# Patient Record
Sex: Male | Born: 1965
Health system: Southern US, Community
[De-identification: ages and names within clinical notes are randomized; demographics above are authoritative.]

## PROBLEM LIST (undated history)

## (undated) DIAGNOSIS — E785 Hyperlipidemia, unspecified: Secondary | ICD-10-CM

## (undated) DIAGNOSIS — R519 Headache, unspecified: Secondary | ICD-10-CM

## (undated) DIAGNOSIS — F32A Depression, unspecified: Secondary | ICD-10-CM

## (undated) DIAGNOSIS — Z87442 Personal history of urinary calculi: Secondary | ICD-10-CM

## (undated) DIAGNOSIS — R51 Headache: Secondary | ICD-10-CM

## (undated) DIAGNOSIS — F329 Major depressive disorder, single episode, unspecified: Secondary | ICD-10-CM

## (undated) DIAGNOSIS — I10 Essential (primary) hypertension: Secondary | ICD-10-CM

## (undated) DIAGNOSIS — B360 Pityriasis versicolor: Secondary | ICD-10-CM

## (undated) DIAGNOSIS — T4145XA Adverse effect of unspecified anesthetic, initial encounter: Secondary | ICD-10-CM

## (undated) DIAGNOSIS — F419 Anxiety disorder, unspecified: Secondary | ICD-10-CM

## (undated) DIAGNOSIS — S069X9A Unspecified intracranial injury with loss of consciousness of unspecified duration, initial encounter: Secondary | ICD-10-CM

## (undated) DIAGNOSIS — T8859XA Other complications of anesthesia, initial encounter: Secondary | ICD-10-CM

## (undated) DIAGNOSIS — G4730 Sleep apnea, unspecified: Secondary | ICD-10-CM

## (undated) DIAGNOSIS — I499 Cardiac arrhythmia, unspecified: Secondary | ICD-10-CM

## (undated) DIAGNOSIS — K219 Gastro-esophageal reflux disease without esophagitis: Secondary | ICD-10-CM

## (undated) DIAGNOSIS — K429 Umbilical hernia without obstruction or gangrene: Secondary | ICD-10-CM

## (undated) HISTORY — PX: OTHER SURGICAL HISTORY: SHX169

## (undated) HISTORY — PX: ADENOIDECTOMY: SUR15

## (undated) HISTORY — PX: COLONOSCOPY: SHX174

## (undated) HISTORY — DX: Essential (primary) hypertension: I10

## (undated) HISTORY — PX: MOUTH SURGERY: SHX715

## (undated) HISTORY — PX: HAND SURGERY: SHX662

## (undated) HISTORY — DX: Hyperlipidemia, unspecified: E78.5

## (undated) HISTORY — PX: ESOPHAGOGASTRODUODENOSCOPY ENDOSCOPY: SHX5814

## (undated) HISTORY — PX: TONSILLECTOMY: SUR1361

## (undated) HISTORY — PX: SIGMOIDOSCOPY: SUR1295

## (undated) HISTORY — DX: Gastro-esophageal reflux disease without esophagitis: K21.9

## (undated) HISTORY — PX: FINGER GANGLION CYST EXCISION: SHX1636

---

## 1992-03-21 HISTORY — PX: CHOLECYSTECTOMY: SHX55

## 1997-07-15 ENCOUNTER — Encounter: Admission: RE | Admit: 1997-07-15 | Discharge: 1997-10-13 | Payer: Self-pay | Admitting: Family Medicine

## 2000-03-21 DIAGNOSIS — S069X9A Unspecified intracranial injury with loss of consciousness of unspecified duration, initial encounter: Secondary | ICD-10-CM

## 2000-03-21 DIAGNOSIS — S069XAA Unspecified intracranial injury with loss of consciousness status unknown, initial encounter: Secondary | ICD-10-CM

## 2000-03-21 HISTORY — DX: Unspecified intracranial injury with loss of consciousness status unknown, initial encounter: S06.9XAA

## 2000-03-21 HISTORY — DX: Unspecified intracranial injury with loss of consciousness of unspecified duration, initial encounter: S06.9X9A

## 2002-03-05 ENCOUNTER — Encounter: Payer: Self-pay | Admitting: Family Medicine

## 2002-03-05 ENCOUNTER — Ambulatory Visit (HOSPITAL_COMMUNITY): Admission: RE | Admit: 2002-03-05 | Discharge: 2002-03-05 | Payer: Self-pay | Admitting: Family Medicine

## 2002-06-16 ENCOUNTER — Ambulatory Visit (HOSPITAL_BASED_OUTPATIENT_CLINIC_OR_DEPARTMENT_OTHER): Admission: RE | Admit: 2002-06-16 | Discharge: 2002-06-16 | Payer: Self-pay | Admitting: Family Medicine

## 2002-12-02 ENCOUNTER — Ambulatory Visit (HOSPITAL_COMMUNITY): Admission: RE | Admit: 2002-12-02 | Discharge: 2002-12-02 | Payer: Self-pay | Admitting: *Deleted

## 2002-12-02 ENCOUNTER — Encounter: Payer: Self-pay | Admitting: *Deleted

## 2003-06-27 ENCOUNTER — Ambulatory Visit (HOSPITAL_COMMUNITY): Admission: RE | Admit: 2003-06-27 | Discharge: 2003-06-27 | Payer: Self-pay | Admitting: Gastroenterology

## 2004-03-06 ENCOUNTER — Ambulatory Visit (HOSPITAL_COMMUNITY): Admission: RE | Admit: 2004-03-06 | Discharge: 2004-03-06 | Payer: Self-pay | Admitting: Family Medicine

## 2008-07-19 HISTORY — PX: CLAVICLE SURGERY: SHX598

## 2008-07-22 ENCOUNTER — Ambulatory Visit (HOSPITAL_COMMUNITY): Admission: RE | Admit: 2008-07-22 | Discharge: 2008-07-23 | Payer: Self-pay | Admitting: Orthopedic Surgery

## 2008-07-22 ENCOUNTER — Encounter: Admission: RE | Admit: 2008-07-22 | Discharge: 2008-07-22 | Payer: Self-pay | Admitting: Orthopedic Surgery

## 2009-03-21 HISTORY — PX: CLAVICLE HARDWARE REMOVAL: SHX1351

## 2009-03-31 ENCOUNTER — Ambulatory Visit (HOSPITAL_COMMUNITY): Admission: RE | Admit: 2009-03-31 | Discharge: 2009-03-31 | Payer: Self-pay | Admitting: Orthopedic Surgery

## 2009-08-18 ENCOUNTER — Ambulatory Visit (HOSPITAL_COMMUNITY): Admission: EM | Admit: 2009-08-18 | Discharge: 2009-08-18 | Payer: Self-pay | Admitting: Emergency Medicine

## 2010-01-19 ENCOUNTER — Encounter: Admission: RE | Admit: 2010-01-19 | Discharge: 2010-01-19 | Payer: Self-pay | Admitting: Internal Medicine

## 2010-06-06 LAB — CBC
MCHC: 35.4 g/dL (ref 30.0–36.0)
Platelets: 209 10*3/uL (ref 150–400)
RDW: 12.7 % (ref 11.5–15.5)

## 2010-06-06 LAB — BASIC METABOLIC PANEL
Calcium: 9.2 mg/dL (ref 8.4–10.5)
GFR calc non Af Amer: >60 mL/min (ref >60)
Potassium: 3.6 mEq/L (ref 3.5–5.1)
Sodium: 138 mEq/L (ref 135–145)

## 2010-06-06 LAB — TYPE AND SCREEN
ABO/RH(D): A POS
Antibody Screen: NEGATIVE

## 2010-06-07 LAB — BASIC METABOLIC PANEL
BUN: 17 mg/dL (ref 6–23)
Calcium: 8.6 mg/dL (ref 8.4–10.5)
GFR calc non Af Amer: >60 mL/min (ref >60)
Potassium: 3.7 mEq/L (ref 3.5–5.1)
Sodium: 137 mEq/L (ref 135–145)

## 2010-06-07 LAB — DIFFERENTIAL
Basophils Absolute: 0 10*3/uL (ref 0.0–0.1)
Eosinophils Relative: 0 % (ref 0–5)
Lymphocytes Relative: 22 % (ref 12–46)
Lymphs Abs: 2 10*3/uL (ref 0.7–4.0)
Neutro Abs: 6.8 10*3/uL (ref 1.7–7.7)

## 2010-06-07 LAB — TYPE AND SCREEN: ABO/RH(D): A POS

## 2010-06-07 LAB — CBC
HCT: 36.4 % — ABNORMAL LOW (ref 39.0–52.0)
Hemoglobin: 12.3 g/dL — ABNORMAL LOW (ref 13.0–17.0)
Platelets: 227 10*3/uL (ref 150–400)
RDW: 13.3 % (ref 11.5–15.5)
WBC: 9.2 10*3/uL (ref 4.0–10.5)

## 2010-06-07 LAB — ABO/RH: ABO/RH(D): A POS

## 2010-06-07 LAB — HEMOCCULT GUIAC POC 1CARD (OFFICE): Fecal Occult Bld: POSITIVE

## 2010-06-30 LAB — CBC
Platelets: 218 10*3/uL (ref 150–400)
RBC: 4.57 MIL/uL (ref 4.22–5.81)
WBC: 7.1 10*3/uL (ref 4.0–10.5)

## 2010-06-30 LAB — BASIC METABOLIC PANEL
BUN: 17 mg/dL (ref 6–23)
Calcium: 9.3 mg/dL (ref 8.4–10.5)
Chloride: 101 mEq/L (ref 96–112)
Creatinine, Ser: 0.86 mg/dL (ref 0.4–1.5)
GFR calc Af Amer: >60 mL/min (ref >60)
GFR calc non Af Amer: >60 mL/min (ref >60)

## 2010-06-30 LAB — TYPE AND SCREEN
ABO/RH(D): A POS
Antibody Screen: NEGATIVE

## 2010-06-30 LAB — ABO/RH: ABO/RH(D): A POS

## 2010-08-03 NOTE — Op Note (Signed)
NAME:  Adam Stephenson, CAFARO NO.:  [id number]   MEDICAL RECORD NO.:  [id number]          PATIENT TYPE:  OIB   LOCATION:  5003                         FACILITY:  [facility]   PHYSICIAN:  Burnard Bunting, M.D.    DATE OF BIRTH:  [birth year]   DATE OF PROCEDURE:  07/22/2008  DATE OF DISCHARGE:                               OPERATIVE REPORT   PREOPERATIVE DIAGNOSIS:  Left displaced comminuted clavicle fracture.   POSTOPERATIVE DIAGNOSIS:  Left displaced comminuted clavicle fracture.   PROCEDURE:  Open reduction internal fixation with pin fixation of left  comminuted displaced clavicle fracture.   SURGEON:  Burnard Bunting, MD   ASSISTANT:  Jerolyn Shin. Lavender, MD   ANESTHESIA:  General endotracheal.   ESTIMATED BLOOD LOSS:  25 mL.   DRAINS:  None.   INDICATIONS:  Vontae Court is a 45 year old patient with displaced left  clavicle fracture who presents for operative management after  explanation of risks and benefits.   PROCEDURE IN DETAIL:  The patient was brought to the operating room  where general endotracheal anesthesia was induced, placed in beach-chair  position with head in neutral position.  Time-out was called.  Left  shoulder area was prescrubed with chlorhexidine, alcohol, and Betadine  which allowed to air dry and then prepped with DuraPrep solution and  draped in sterile manner.  Collier Flowers was used to cover the operative field.  Clavicle fracture was palpated.  The patient was morbidly obese, and  that made the entire procedure more difficult and had significant levels  of difficulty to the procedure due to exposure.  Anterior incision was  made over the clavicle fracture.  Skin and subcutaneous tissue were  sharply divided.  Platysma muscle was divided.  Crossing sensory nerve  was protected.  Muscle was split.  Comminuted fracture fragments were  exposed.  The medial end was then drilled by hand and tapped.  The  lateral end was drilled by hand and tapped.  The  lateral cortex was  punctured, and under fluoroscopic guidance, pin was placed retrograde to  the lateral side taking out through a separate stab incision in the  posterolateral aspect of the shoulder, and the fracture was reduced, and  the pin was taken across with good thread purchase into the medial  fragment site.  Two comminuted fragments were then placed back into  their position and wrapped with #1 Vicryl suture.  Under fluoroscopic  guidance, the fracture was well reduced, it was stable with pin  fixation.  At this time, the incision was thoroughly irrigated and  closed using #1 Vicryl suture, 2-0 Vicryl suture, and  skin with staples.  Dr. Lenny Pastel assistance was required at all time  during the case for retraction of important neurovascular structures and  limb positioning.  Case was also made more difficult by the patient's  size.  The patient was placed in a shoulder sling.  He tolerated the  procedure well without immediate complications.      Burnard Bunting, M.D.  Electronically Signed     GSD/MEDQ  D:  07/22/2008  T:  07/23/2008  Job:  [id number]

## 2010-08-06 NOTE — Op Note (Signed)
NAME:  Adam Stephenson, Adam Stephenson                      ACCOUNT NO.:  [id number]   MEDICAL RECORD NO.:  [id number]                   PATIENT TYPE:  AMB   LOCATION:  ENDO                                 [facility]   PHYSICIAN:  John C. Madilyn Fireman, M.D.                 DATE OF BIRTH:  01/30/1966   DATE OF PROCEDURE:  06/27/2003  DATE OF DISCHARGE:                                 OPERATIVE REPORT   PROCEDURE:  Colonoscopy.   INDICATIONS FOR PROCEDURE:  Intermittent rectal bleeding in a 45 year old  patient with a family history of colon polyps.   DESCRIPTION OF PROCEDURE:  The patient was placed in the left lateral  decubitus position then placed on the pulse monitor with continuous low flow  oxygen delivered by nasal cannula. He was sedated with 62.5 mcg IV fentanyl  and 7 mg IV Versed. The Olympus video colonoscope was inserted into the  rectum and advanced to the cecum, confirmed by transillumination at  McBurney's point and visualization of the ileocecal valve and appendiceal  orifice. The prep was excellent. The cecum, ascending, transverse,  descending and sigmoid colon all appeared normal with no masses, polyps,  diverticula or other mucosal abnormalities. The rectum likewise appeared  normal and retroflexed view of the anus revealed only small internal  hemorrhoids, no stigma of hemorrhage. The scope was then withdrawn and the  patient returned to the recovery room in stable condition. The patient  tolerated the procedure well and there were no immediate complications.   IMPRESSION:  Minimal internal hemorrhoids otherwise normal study.   PLAN:  Next colon screening by colonoscopy in 10 years.                                               John C. Madilyn Fireman, M.D.    JCH/MEDQ  D:  06/27/2003  T:  06/27/2003  Job:  [id number]

## 2011-10-06 ENCOUNTER — Other Ambulatory Visit: Payer: Self-pay | Admitting: Family Medicine

## 2011-10-06 DIAGNOSIS — R609 Edema, unspecified: Secondary | ICD-10-CM

## 2011-10-10 ENCOUNTER — Other Ambulatory Visit: Payer: Self-pay

## 2011-10-11 ENCOUNTER — Ambulatory Visit
Admission: RE | Admit: 2011-10-11 | Discharge: 2011-10-11 | Disposition: A | Discharge status: Home / Self Care | Payer: 59 | Source: Ambulatory Visit | Attending: Family Medicine | Admitting: Family Medicine

## 2011-10-11 DIAGNOSIS — R609 Edema, unspecified: Secondary | ICD-10-CM

## 2013-01-22 ENCOUNTER — Ambulatory Visit: Payer: Self-pay

## 2013-02-01 ENCOUNTER — Ambulatory Visit (INDEPENDENT_AMBULATORY_CARE_PROVIDER_SITE_OTHER): Payer: 59

## 2013-02-01 ENCOUNTER — Other Ambulatory Visit: Payer: Self-pay | Admitting: *Deleted

## 2013-02-01 VITALS — BP 146/86 | HR 73 | Resp 16

## 2013-02-01 DIAGNOSIS — B351 Tinea unguium: Secondary | ICD-10-CM

## 2013-02-01 DIAGNOSIS — B353 Tinea pedis: Secondary | ICD-10-CM

## 2013-02-01 MED ORDER — TAVABOROLE 5 % EX SOLN: 1.0000 [drp] | Freq: Two times a day (BID) | CUTANEOUS | Status: DC

## 2013-02-01 MED ORDER — NAFTIFINE HCL 2 % EX CREA: 2.0000 g | TOPICAL_CREAM | Freq: Two times a day (BID) | CUTANEOUS | Status: DC

## 2013-02-01 NOTE — Patient Instructions (Signed)

## 2013-02-01 NOTE — Progress Notes (Signed)
  Subjective:    Patient ID: Adam Stephenson, male    DOB: October 08, 1965, 47 y.o.   MRN: [id number]  HPI Comments: "I still have this athletes feet, the toenails too"  N - none  L - plantar feet and toenails bilateral D - few years O - gradual C - dry, scaly skin and thick, discolored toenails A - none T - treated in Jan. 2013 with Naftin and fungi nail - used for a short time.       Review of Systems  Musculoskeletal: Positive for gait problem.  All other systems reviewed and are negative.       Objective:   Physical Exam Neurovascular status intact and unchanged pedal pulses palpable epicritic and proprioceptive sensations intact and symmetric. Patient continues to have some distal subungual onychomycosis of the hallux and lesser digits nails he is topical Fungi-Nail for several months however fail to do it on a consistent basis this was proximally 1 year ago. Patient continues to have thickening and friability of nails and request retreatment. Orthopedic biomechanical exam otherwise unremarkable noncontributory. Patient also has a much distribution of tinea with an erythematous plaque and interdigital involvement previsit treated with Naftin successfully house had recurrence due to the persistent of the nail fungus providing a source for reinfection.       Assessment & Plan:  Onychomycosis distal subungual affecting 1 through 5 nails bilateral. Also secondary tinea pedis chronic reinfection is associated with the nail infection as well. Plan at this time prescription was issued for Naftin cream 2% be applied twice daily to the affected areas of skin for at least a 4 week duration and repeat if any recurrence . Patient is also dispensed a prescription for KERYDIN 5% new topical nail antifungal. He is given the coupons for both of Naftin and keratin was instructions apply both twice daily the nail treatment should take place for at least 12 months in duration. Patient is also advised and  other treatments for both his shoes and socks also antiperspirant to help reduce moisture and perspiration in his shoes. Followup in 6-12 months as needed  Alvan Dame DPM

## 2014-03-21 HISTORY — PX: HEMORRHOID SURGERY: SHX153

## 2014-07-22 ENCOUNTER — Emergency Department (HOSPITAL_COMMUNITY)
Admission: EM | Admit: 2014-07-22 | Discharge: 2014-07-22 | Disposition: A | Discharge status: Home / Self Care | Payer: 59 | Attending: Emergency Medicine | Admitting: Emergency Medicine

## 2014-07-22 ENCOUNTER — Encounter (HOSPITAL_COMMUNITY): Payer: Self-pay

## 2014-07-22 ENCOUNTER — Emergency Department (HOSPITAL_COMMUNITY): Payer: 59

## 2014-07-22 DIAGNOSIS — E785 Hyperlipidemia, unspecified: Secondary | ICD-10-CM | POA: Diagnosis not present

## 2014-07-22 DIAGNOSIS — I10 Essential (primary) hypertension: Secondary | ICD-10-CM | POA: Insufficient documentation

## 2014-07-22 DIAGNOSIS — Z79899 Other long term (current) drug therapy: Secondary | ICD-10-CM | POA: Insufficient documentation

## 2014-07-22 DIAGNOSIS — Z792 Long term (current) use of antibiotics: Secondary | ICD-10-CM | POA: Insufficient documentation

## 2014-07-22 DIAGNOSIS — K219 Gastro-esophageal reflux disease without esophagitis: Secondary | ICD-10-CM | POA: Insufficient documentation

## 2014-07-22 DIAGNOSIS — R221 Localized swelling, mass and lump, neck: Secondary | ICD-10-CM

## 2014-07-22 DIAGNOSIS — R51 Headache: Secondary | ICD-10-CM | POA: Diagnosis present

## 2014-07-22 DIAGNOSIS — L089 Local infection of the skin and subcutaneous tissue, unspecified: Secondary | ICD-10-CM | POA: Diagnosis not present

## 2014-07-22 LAB — BASIC METABOLIC PANEL
ANION GAP: 9 (ref 5–15)
BUN: 12 mg/dL (ref 6–20)
CALCIUM: 9.1 mg/dL (ref 8.9–10.3)
CO2: 27 mmol/L (ref 22–32)
CREATININE: 0.95 mg/dL (ref 0.61–1.24)
Chloride: 102 mmol/L (ref 101–111)
GFR calc non Af Amer: >60 mL/min (ref >60)
Glucose, Bld: 95 mg/dL (ref 70–99)
Potassium: 4 mmol/L (ref 3.5–5.1)
SODIUM: 138 mmol/L (ref 135–145)

## 2014-07-22 LAB — CBC WITH DIFFERENTIAL/PLATELET
Basophils Absolute: 0 10*3/uL (ref 0.0–0.1)
Basophils Relative: 0 % (ref 0–1)
Eosinophils Absolute: 0.1 10*3/uL (ref 0.0–0.7)
Eosinophils Relative: 2 % (ref 0–5)
HCT: 41.8 % (ref 39.0–52.0)
HEMOGLOBIN: 14.4 g/dL (ref 13.0–17.0)
LYMPHS ABS: 3.8 10*3/uL (ref 0.7–4.0)
LYMPHS PCT: 49 % — AB (ref 12–46)
MCH: 30.5 pg (ref 26.0–34.0)
MCHC: 34.4 g/dL (ref 30.0–36.0)
MCV: 88.6 fL (ref 78.0–100.0)
MONOS PCT: 5 % (ref 3–12)
Monocytes Absolute: 0.4 10*3/uL (ref 0.1–1.0)
NEUTROS ABS: 3.4 10*3/uL (ref 1.7–7.7)
NEUTROS PCT: 44 % (ref 43–77)
PLATELETS: 214 10*3/uL (ref 150–400)
RBC: 4.72 MIL/uL (ref 4.22–5.81)
RDW: 13.1 % (ref 11.5–15.5)
WBC: 7.8 10*3/uL (ref 4.0–10.5)

## 2014-07-22 MED ORDER — IOHEXOL 300 MG/ML  SOLN
80.0000 mL | Freq: Once | INTRAMUSCULAR | Status: AC | PRN
  Administered 2014-07-22: 80 mL via INTRAVENOUS

## 2014-07-22 NOTE — ED Provider Notes (Signed)
CSN: [id number]     Arrival date & time 07/22/14  1532 History   First MD Initiated Contact with Patient 07/22/14 1752     Chief Complaint  Patient presents with  . Headache      Patient is a 49 y.o. male presenting with headaches. The history is provided by the patient. No language interpreter was used.  Headache  Mr. Sabala presents for evaluation of scalp swelling.  He awoke yesterday with swelling behind his right ear with associated pain.  He saw his pcp yesterday, who started him on Doxcycline BID and referred to general surgery.  He was seen by Dermatology today, who referred him the ED for CT scan.  He reports pain in the area and pain with rotating his head to the left.  He denies any recent trauma, fevers, ear pain, N/V, numbness, weakness.  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  Sxs are moderate, constant, worsening.    Past Medical History  Diagnosis Date  . Hyperlipidemia   . Hypertension   . GERD (gastroesophageal reflux disease)    Past Surgical History  Procedure Laterality Date  . Cholecystectomy    . Clavicle surgery    . Hand surgery    . Finger ganglion cyst excision    . Mouth surgery    . Tonsillectomy    . Adenoidectomy    . Esophagogastroduodenoscopy endoscopy    . Colonoscopy    . Sigmoidoscopy     No family history on file. History  Substance Use Topics  . Smoking status: Never Smoker   . Smokeless tobacco: Not on file  . Alcohol Use: Yes     Comment: occassional    Review of Systems  Neurological: Positive for headaches.  All other systems reviewed and are negative.     Allergies  Lexapro  Home Medications   Prior to Admission medications   Medication Sig Start Date End Date Taking? Authorizing Provider  atorvastatin (LIPITOR) 40 MG tablet Take 40 mg by mouth daily.  12/07/12  Yes Historical Provider, MD  doxycycline (VIBRA-TABS) 100 MG tablet Take 100 mg by mouth 2 (two) times daily. Start medication on 07-21-14    Yes Historical Provider, MD  pantoprazole (PROTONIX) 40 MG tablet Take 40 mg by mouth daily.   Yes Historical Provider, MD  ramipril (ALTACE) 10 MG capsule Take 10 mg by mouth.  01/03/13  Yes Historical Provider, MD  Naftifine HCl (NAFTIN) 2 % CREA Apply 2 g topically 2 (two) times daily. Patient not taking: Reported on 07/22/2014 02/01/13   Harriet Masson, DPM  Tavaborole (KERYDIN) 5 % SOLN Apply 1 drop topically 2 (two) times daily. Patient not taking: Reported on 07/22/2014 02/01/13   Harriet Masson, DPM   BP 168/93 mmHg  Pulse 70  Temp(Src) 98 F (36.7 C) (Oral)  Resp 20  Ht 5\' 11"  (1.803 m)  Wt 341 lb (154.677 kg)  BMI 47.58 kg/m2  SpO2 96% Physical Exam  Constitutional: He is oriented to person, place, and time. He appears well-developed and well-nourished.  HENT:  Head: Normocephalic and atraumatic.  Right Ear: External ear normal.  Left Ear: External ear normal.  Soft tissue swelling and tenderness starting at the right mastoid and extending approx 4cm posteriorly.    Eyes: EOM are normal. Pupils are equal, round, and reactive to light.  Neck: Neck supple.  Cardiovascular: Normal rate and regular rhythm.   No murmur heard. Pulmonary/Chest: Effort normal. No respiratory distress.  Musculoskeletal: He exhibits no edema or tenderness.  Lymphadenopathy:    He has no cervical adenopathy.  Neurological: He is alert and oriented to person, place, and time.  Skin: Skin is warm and dry.  Psychiatric: He has a normal mood and affect. His behavior is normal.  Nursing note and vitals reviewed.   ED Course  Procedures (including critical care time) Labs Review Labs Reviewed  CBC WITH DIFFERENTIAL/PLATELET - Abnormal; Notable for the following:    Lymphocytes Relative 49 (*)    All other components within normal limits  BASIC METABOLIC PANEL    Imaging Review Ct Soft Tissue Neck W Contrast  07/22/2014   CLINICAL DATA:  Right neck mass.  Swelling above right ear.  EXAM: CT  NECK WITH CONTRAST  TECHNIQUE: Multidetector CT imaging of the neck was performed using the standard protocol following the bolus administration of intravenous contrast.  CONTRAST:  41mL OMNIPAQUE IOHEXOL 300 MG/ML  SOLN  COMPARISON:  None.  FINDINGS: Pharynx and larynx: Normal  Salivary glands: No inflammation.  Thyroid: Normal no nodularity.  Lymph nodes: No pathologically enlarged lymph nodes. Scattered level 2 nodes.  Vascular: Normal carotid she has.  No vascular abnormality.  Limited intracranial: Limited view of the inferior brain is unremarkable.  Visualized orbits: Normal.  Mastoids and visualized paranasal sinuses: Mastoid air cells are clear. Paranasal sinuses are clear.  Skeleton: Mild degenerate change of the cervical spine.  Upper chest: Upper lungs are clear.  There is a small subcutaneous nodule in the right occipital region superficial to the paraspinal musculature. This nodule measures 10 mm (image 33, series 2). There is mild subcutaneous stranding adjacent to this nodule.  IMPRESSION: 1. Small superficial subcutaneous nodule in the right occipital region likely represent small focus of dermal infection. There is mild subcutaneous fat stranding associated with this likely infectious nodule. If lesion persists after antibiotic treatment, consider reimaging and potential biopsy. 2. No lymphadenopathy.   Electronically Signed   By: Suzy Bouchard M.D.   On: 07/22/2014 20:50     EKG Interpretation None      MDM   Final diagnoses:  Soft tissue infection    Patient here for evaluation of swelling posterior to the right ear. Patient is nontoxic appearing on examination, there is no evidence of acute otitis media. Discussed with radiologist who recommended CT temporal bone, which was revised to soft tissue neck. CT scan demonstrates stranding surrounding a subcutaneous nodule. This is consistent with examination. Patient has just been initiated on doxycycline, will continue this therapy  with close PCP follow-up as well as return precautions. Presentation is not consistent with mastoiditis.  Quintella Reichert, MD 07/23/14 [phone]

## 2014-07-22 NOTE — ED Notes (Signed)
The pt is waiting for a c-t scan of his head.  His wife is asking when he is going to be moved into a room.  No rooms available now.

## 2014-07-22 NOTE — ED Notes (Signed)
To ct

## 2014-07-22 NOTE — Discharge Instructions (Signed)
Continue your Doxycycline.  Get rechecked by your family doctor in the next two days. Get rechecked sooner if you develop worsening swelling, pain, or new concerning symptoms.  If your symptoms do not improve with antibiotics you may need a biopsy of the area.    Abscess An abscess is an infected area that contains a collection of pus and debris.It can occur in almost any part of the body. An abscess is also known as a furuncle or boil. CAUSES  An abscess occurs when tissue gets infected. This can occur from blockage of oil or sweat glands, infection of hair follicles, or a minor injury to the skin. As the body tries to fight the infection, pus collects in the area and creates pressure under the skin. This pressure causes pain. People with weakened immune systems have difficulty fighting infections and get certain abscesses more often.  SYMPTOMS Usually an abscess develops on the skin and becomes a painful mass that is red, warm, and tender. If the abscess forms under the skin, you may feel a moveable soft area under the skin. Some abscesses break open (rupture) on their own, but most will continue to get worse without care. The infection can spread deeper into the body and eventually into the bloodstream, causing you to feel ill.  DIAGNOSIS  Your caregiver will take your medical history and perform a physical exam. A sample of fluid may also be taken from the abscess to determine what is causing your infection. TREATMENT  Your caregiver may prescribe antibiotic medicines to fight the infection. However, taking antibiotics alone usually does not cure an abscess. Your caregiver may need to make a small cut (incision) in the abscess to drain the pus. In some cases, gauze is packed into the abscess to reduce pain and to continue draining the area. HOME CARE INSTRUCTIONS   Only take over-the-counter or prescription medicines for pain, discomfort, or fever as directed by your caregiver.  If you were  prescribed antibiotics, take them as directed. Finish them even if you start to feel better.  If gauze is used, follow your caregiver's directions for changing the gauze.  To avoid spreading the infection:  Keep your draining abscess covered with a bandage.  Wash your hands well.  Do not share personal care items, towels, or whirlpools with others.  Avoid skin contact with others.  Keep your skin and clothes clean around the abscess.  Keep all follow-up appointments as directed by your caregiver. SEEK MEDICAL CARE IF:   You have increased pain, swelling, redness, fluid drainage, or bleeding.  You have muscle aches, chills, or a general ill feeling.  You have a fever. MAKE SURE YOU:   Understand these instructions.  Will watch your condition.  Will get help right away if you are not doing well or get worse. Document Released: 12/15/2004 Document Revised: 09/06/2011 Document Reviewed: 05/20/2011 [facility] Patient Information 2015 [place], [place]. This information is not intended to replace advice given to you by your health care provider. Make sure you discuss any questions you have with your health care provider.

## 2014-07-22 NOTE — ED Notes (Addendum)
Pt. Has a swelling above his rt. Ear, occipital area.  The are is soft to touch, the area is solid and tender to touch,  Pt. Reports noticed it on Monday morning, denies any injuries.    Pt. Does have a headache , had one episode of dizziness and denies any fevers, n/v/d.  GCS 15, Skin is warm and dry.  No neuro deficits noted.  Pt. Denies any ear ache.  Pt. Was seen by Dr. Moreen Fowler of [facility] and they placed him on Doxycycline has been taking it for 3 days no change.  Dr.  Moreen Fowler was going to send him to General Surgery and they referred him to Dermatology which he saw today and they referred him to [place] for a  CT scan

## 2014-11-15 ENCOUNTER — Other Ambulatory Visit: Payer: Self-pay

## 2015-07-01 ENCOUNTER — Emergency Department (HOSPITAL_COMMUNITY)
Admission: EM | Admit: 2015-07-01 | Discharge: 2015-07-02 | Disposition: A | Discharge status: Home / Self Care | Payer: 59 | Attending: Emergency Medicine | Admitting: Emergency Medicine

## 2015-07-01 ENCOUNTER — Encounter (HOSPITAL_COMMUNITY): Payer: Self-pay

## 2015-07-01 DIAGNOSIS — S60511A Abrasion of right hand, initial encounter: Secondary | ICD-10-CM | POA: Insufficient documentation

## 2015-07-01 DIAGNOSIS — I10 Essential (primary) hypertension: Secondary | ICD-10-CM | POA: Insufficient documentation

## 2015-07-01 DIAGNOSIS — Y998 Other external cause status: Secondary | ICD-10-CM | POA: Insufficient documentation

## 2015-07-01 DIAGNOSIS — Y92410 Unspecified street and highway as the place of occurrence of the external cause: Secondary | ICD-10-CM | POA: Insufficient documentation

## 2015-07-01 DIAGNOSIS — S60512A Abrasion of left hand, initial encounter: Secondary | ICD-10-CM | POA: Diagnosis not present

## 2015-07-01 DIAGNOSIS — S40812A Abrasion of left upper arm, initial encounter: Secondary | ICD-10-CM | POA: Diagnosis not present

## 2015-07-01 DIAGNOSIS — Y9389 Activity, other specified: Secondary | ICD-10-CM | POA: Diagnosis not present

## 2015-07-01 DIAGNOSIS — K219 Gastro-esophageal reflux disease without esophagitis: Secondary | ICD-10-CM | POA: Diagnosis not present

## 2015-07-01 DIAGNOSIS — S80212A Abrasion, left knee, initial encounter: Secondary | ICD-10-CM | POA: Diagnosis not present

## 2015-07-01 DIAGNOSIS — S90416A Abrasion, unspecified lesser toe(s), initial encounter: Secondary | ICD-10-CM | POA: Diagnosis not present

## 2015-07-01 DIAGNOSIS — S80211A Abrasion, right knee, initial encounter: Secondary | ICD-10-CM | POA: Insufficient documentation

## 2015-07-01 DIAGNOSIS — Z23 Encounter for immunization: Secondary | ICD-10-CM | POA: Diagnosis not present

## 2015-07-01 DIAGNOSIS — Z792 Long term (current) use of antibiotics: Secondary | ICD-10-CM | POA: Diagnosis not present

## 2015-07-01 DIAGNOSIS — S42021A Displaced fracture of shaft of right clavicle, initial encounter for closed fracture: Secondary | ICD-10-CM | POA: Diagnosis not present

## 2015-07-01 DIAGNOSIS — E785 Hyperlipidemia, unspecified: Secondary | ICD-10-CM | POA: Diagnosis not present

## 2015-07-01 DIAGNOSIS — Z79899 Other long term (current) drug therapy: Secondary | ICD-10-CM | POA: Insufficient documentation

## 2015-07-01 DIAGNOSIS — S42001A Fracture of unspecified part of right clavicle, initial encounter for closed fracture: Secondary | ICD-10-CM

## 2015-07-01 DIAGNOSIS — S4991XA Unspecified injury of right shoulder and upper arm, initial encounter: Secondary | ICD-10-CM | POA: Diagnosis present

## 2015-07-01 LAB — CBC WITH DIFFERENTIAL/PLATELET
BASOS ABS: 0 10*3/uL (ref 0.0–0.1)
Basophils Relative: 0 %
Eosinophils Absolute: 0.1 10*3/uL (ref 0.0–0.7)
Eosinophils Relative: 1 %
HCT: 41.8 % (ref 39.0–52.0)
HEMOGLOBIN: 14 g/dL (ref 13.0–17.0)
LYMPHS ABS: 4.2 10*3/uL — AB (ref 0.7–4.0)
LYMPHS PCT: 29 %
MCH: 30.5 pg (ref 26.0–34.0)
MCHC: 33.5 g/dL (ref 30.0–36.0)
MCV: 91.1 fL (ref 78.0–100.0)
Monocytes Absolute: 0.6 10*3/uL (ref 0.1–1.0)
Monocytes Relative: 4 %
NEUTROS ABS: 9.3 10*3/uL — AB (ref 1.7–7.7)
NEUTROS PCT: 66 %
PLATELETS: 236 10*3/uL (ref 150–400)
RBC: 4.59 MIL/uL (ref 4.22–5.81)
RDW: 13.4 % (ref 11.5–15.5)
WBC: 14.1 10*3/uL — AB (ref 4.0–10.5)

## 2015-07-01 LAB — I-STAT CHEM 8, ED
BUN: 23 mg/dL — ABNORMAL HIGH (ref 6–20)
CALCIUM ION: 1.14 mmol/L (ref 1.12–1.23)
CHLORIDE: 106 mmol/L (ref 101–111)
Creatinine, Ser: 1.1 mg/dL (ref 0.61–1.24)
GLUCOSE: 124 mg/dL — AB (ref 65–99)
HCT: 43 % (ref 39.0–52.0)
Hemoglobin: 14.6 g/dL (ref 13.0–17.0)
POTASSIUM: 3.6 mmol/L (ref 3.5–5.1)
Sodium: 143 mmol/L (ref 135–145)
TCO2: 23 mmol/L (ref 0–100)

## 2015-07-01 LAB — CBG MONITORING, ED: GLUCOSE-CAPILLARY: 112 mg/dL — AB (ref 65–99)

## 2015-07-01 MED ORDER — SODIUM CHLORIDE 0.9 % IV BOLUS (SEPSIS)
1000.0000 mL | Freq: Once | INTRAVENOUS | Status: AC
  Administered 2015-07-02: 1000 mL via INTRAVENOUS

## 2015-07-01 MED ORDER — TETANUS-DIPHTH-ACELL PERTUSSIS 5-2.5-18.5 LF-MCG/0.5 IM SUSP
0.5000 mL | Freq: Once | INTRAMUSCULAR | Status: AC
  Administered 2015-07-02: 0.5 mL via INTRAMUSCULAR
  Filled 2015-07-01: qty 0.5

## 2015-07-01 MED ORDER — IOPAMIDOL (ISOVUE-300) INJECTION 61%
INTRAVENOUS | Status: AC
  Administered 2015-07-02: 100 mL
  Filled 2015-07-01: qty 100

## 2015-07-01 MED ORDER — MORPHINE SULFATE (PF) 4 MG/ML IV SOLN
4.0000 mg | Freq: Once | INTRAVENOUS | Status: AC
  Administered 2015-07-02: 4 mg via INTRAVENOUS
  Filled 2015-07-01: qty 1

## 2015-07-01 NOTE — ED Notes (Signed)
Pt transported to CT ?
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
## 2015-07-01 NOTE — ED Notes (Signed)
This RN attempted IV access x1 w/o success.

## 2015-07-01 NOTE — ED Notes (Signed)
Pt returned from XR and CT in no apparent distress
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
## 2015-07-02 ENCOUNTER — Encounter (HOSPITAL_COMMUNITY): Payer: Self-pay | Admitting: Radiology

## 2015-07-02 ENCOUNTER — Emergency Department (HOSPITAL_COMMUNITY): Payer: 59

## 2015-07-02 DIAGNOSIS — S42021A Displaced fracture of shaft of right clavicle, initial encounter for closed fracture: Secondary | ICD-10-CM | POA: Diagnosis not present

## 2015-07-02 LAB — COMPREHENSIVE METABOLIC PANEL
ALT: 41 U/L (ref 17–63)
ANION GAP: 11 (ref 5–15)
AST: 55 U/L — ABNORMAL HIGH (ref 15–41)
Albumin: 3.9 g/dL (ref 3.5–5.0)
Alkaline Phosphatase: 70 U/L (ref 38–126)
BUN: 20 mg/dL (ref 6–20)
CO2: 22 mmol/L (ref 22–32)
Calcium: 9.2 mg/dL (ref 8.9–10.3)
Chloride: 108 mmol/L (ref 101–111)
Creatinine, Ser: 1.1 mg/dL (ref 0.61–1.24)
GFR calc non Af Amer: >60 mL/min (ref >60)
Glucose, Bld: 129 mg/dL — ABNORMAL HIGH (ref 65–99)
Potassium: 5.2 mmol/L — ABNORMAL HIGH (ref 3.5–5.1)
Sodium: 141 mmol/L (ref 135–145)
Total Bilirubin: 1.4 mg/dL — ABNORMAL HIGH (ref 0.3–1.2)
Total Protein: 6.8 g/dL (ref 6.5–8.1)

## 2015-07-02 LAB — ETHANOL: Alcohol, Ethyl (B): <5 mg/dL (ref <5)

## 2015-07-02 MED ORDER — HYDROMORPHONE HCL 1 MG/ML IJ SOLN
1.0000 mg | Freq: Once | INTRAMUSCULAR | Status: AC
  Administered 2015-07-02: 1 mg via INTRAVENOUS
  Filled 2015-07-02: qty 1

## 2015-07-02 MED ORDER — OXYCODONE HCL 5 MG PO TABS: 5.0000 mg | ORAL_TABLET | Freq: Two times a day (BID) | ORAL | Status: DC | PRN

## 2015-07-02 NOTE — Discharge Instructions (Signed)
Clavicle Fracture Adam Stephenson, your imaging only reveals a clavicular fracture.  See Dr. Marlou Sa within 3 days for close follow up and wear the sling at all times.  Take tylenol or ibuprofen for pain as needed.  If pain becomes severe, then take oxycodone.  For any worsening symptoms, come back to the ED immediately. Thank you.   A clavicle fracture is a broken collarbone. The collarbone is the long bone that connects your shoulder to your rib cage. A broken collarbone may be treated with a sling, a wrap, or surgery. Treatment depends on whether the broken ends of the bone are out of place or not. HOME CARE  Put ice on the injured area:  Put ice in a plastic bag.  Place a towel between your skin and the bag.  Leave the ice on for 20 minutes, 2-3 times a day.  If you have a wrap or splint:  Wear it all the time, and remove it only to take a bath or shower.  When you bathe or shower, keep your shoulder in the same place as when the sling or wrap is on.  Do not lift your arm.  If you have a wrap:  Another person must tighten it every day.  It should be tight enough to hold your shoulders back.  Make sure you have enough room to put your pointer finger between your body and the strap.  Loosen the wrap right away if you cannot feel your arm or your hands tingle.  Only take medicines as told by your doctor.  Avoid activities that make the injury or pain worse for 4-6 weeks after surgery.  Keep all follow-up appointments. GET HELP IF:  Your medicine is not making you feel less pain.  Your medicine is not making swelling better. GET HELP RIGHT AWAY IF:   Your cannot feel your arm.  Your arm is cold.  Your arm is a lighter color than normal. MAKE SURE YOU:   Understand these instructions.  Will watch your condition.  Will get help right away if you are not doing well or get worse.   This information is not intended to replace advice given to you by your health care  provider. Make sure you discuss any questions you have with your health care provider.   Document Released: 08/24/2007 Document Revised: 03/12/2013 Document Reviewed: 01/28/2013 Elsevier Interactive Patient Education [employer].

## 2015-07-02 NOTE — ED Notes (Signed)
Patient transported to X-ray 

## 2015-07-02 NOTE — ED Notes (Signed)
Pt returned from XR no apparent distress

## 2015-07-06 ENCOUNTER — Other Ambulatory Visit: Payer: Self-pay | Admitting: Orthopedic Surgery

## 2015-07-06 DIAGNOSIS — M25511 Pain in right shoulder: Secondary | ICD-10-CM

## 2015-07-07 ENCOUNTER — Ambulatory Visit
Admission: RE | Admit: 2015-07-07 | Discharge: 2015-07-07 | Disposition: A | Discharge status: Home / Self Care | Payer: 59 | Source: Ambulatory Visit | Attending: Orthopedic Surgery | Admitting: Orthopedic Surgery

## 2015-07-07 DIAGNOSIS — M25511 Pain in right shoulder: Secondary | ICD-10-CM

## 2015-07-09 ENCOUNTER — Other Ambulatory Visit: Payer: Self-pay | Admitting: Orthopedic Surgery

## 2015-07-13 ENCOUNTER — Encounter (HOSPITAL_COMMUNITY)
Admission: RE | Admit: 2015-07-13 | Discharge: 2015-07-13 | Disposition: A | Discharge status: Home / Self Care | Payer: 59 | Source: Ambulatory Visit | Attending: Orthopedic Surgery | Admitting: Orthopedic Surgery

## 2015-07-13 ENCOUNTER — Encounter (HOSPITAL_COMMUNITY): Payer: Self-pay

## 2015-07-13 DIAGNOSIS — Z87820 Personal history of traumatic brain injury: Secondary | ICD-10-CM | POA: Diagnosis not present

## 2015-07-13 DIAGNOSIS — I10 Essential (primary) hypertension: Secondary | ICD-10-CM | POA: Diagnosis not present

## 2015-07-13 DIAGNOSIS — S42001A Fracture of unspecified part of right clavicle, initial encounter for closed fracture: Secondary | ICD-10-CM | POA: Diagnosis not present

## 2015-07-13 DIAGNOSIS — G5601 Carpal tunnel syndrome, right upper limb: Secondary | ICD-10-CM | POA: Diagnosis not present

## 2015-07-13 DIAGNOSIS — E785 Hyperlipidemia, unspecified: Secondary | ICD-10-CM | POA: Diagnosis not present

## 2015-07-13 HISTORY — DX: Unspecified intracranial injury with loss of consciousness of unspecified duration, initial encounter: S06.9X9A

## 2015-07-13 HISTORY — DX: Anxiety disorder, unspecified: F41.9

## 2015-07-13 HISTORY — DX: Other complications of anesthesia, initial encounter: T88.59XA

## 2015-07-13 HISTORY — DX: Major depressive disorder, single episode, unspecified: F32.9

## 2015-07-13 HISTORY — DX: Headache: R51

## 2015-07-13 HISTORY — DX: Headache, unspecified: R51.9

## 2015-07-13 HISTORY — DX: Sleep apnea, unspecified: G47.30

## 2015-07-13 HISTORY — DX: Adverse effect of unspecified anesthetic, initial encounter: T41.45XA

## 2015-07-13 HISTORY — DX: Depression, unspecified: F32.A

## 2015-07-13 HISTORY — DX: Cardiac arrhythmia, unspecified: I49.9

## 2015-07-13 LAB — CBC
HEMATOCRIT: 43.8 % (ref 39.0–52.0)
HEMOGLOBIN: 15.1 g/dL (ref 13.0–17.0)
MCH: 31.1 pg (ref 26.0–34.0)
MCHC: 34.5 g/dL (ref 30.0–36.0)
MCV: 90.3 fL (ref 78.0–100.0)
Platelets: 265 10*3/uL (ref 150–400)
RBC: 4.85 MIL/uL (ref 4.22–5.81)
RDW: 13.2 % (ref 11.5–15.5)
WBC: 10.1 10*3/uL (ref 4.0–10.5)

## 2015-07-13 LAB — COMPREHENSIVE METABOLIC PANEL
ALBUMIN: 4 g/dL (ref 3.5–5.0)
ALK PHOS: 91 U/L (ref 38–126)
ALT: 30 U/L (ref 17–63)
ANION GAP: 11 (ref 5–15)
AST: 19 U/L (ref 15–41)
BILIRUBIN TOTAL: 0.9 mg/dL (ref 0.3–1.2)
BUN: 16 mg/dL (ref 6–20)
CALCIUM: 9.6 mg/dL (ref 8.9–10.3)
CO2: 28 mmol/L (ref 22–32)
Chloride: 100 mmol/L — ABNORMAL LOW (ref 101–111)
Creatinine, Ser: 1.05 mg/dL (ref 0.61–1.24)
GFR calc Af Amer: >60 mL/min (ref >60)
GFR calc non Af Amer: >60 mL/min (ref >60)
GLUCOSE: 105 mg/dL — AB (ref 65–99)
Potassium: 3.9 mmol/L (ref 3.5–5.1)
Sodium: 139 mmol/L (ref 135–145)
Total Protein: 7.6 g/dL (ref 6.5–8.1)

## 2015-07-13 MED ORDER — CEFAZOLIN SODIUM 10 G IJ SOLR
3.0000 g | INTRAMUSCULAR | Status: AC
  Administered 2015-07-14: 3 g via INTRAVENOUS
  Filled 2015-07-13: qty 3000

## 2015-07-13 NOTE — Pre-Procedure Instructions (Addendum)
Adam Stephenson  07/13/2015      [id number] - [place], [place] - [address] [place] [address] Phone: [phone] Fax: [phone]    Your procedure is scheduled on April 25  Report to [facility] at 800 A.M.  Call this number if you have problems the morning of surgery:  [phone]   Remember:  Do not eat food or drink liquids after midnight.  Take these medicines the morning of surgery with A SIP OF WATER  Oxycodone (Roxicodone) if needed for pain, Pantoprazole (Protonix)   Stop taking aspirin, BC's, Goody's, Ibuprofen, Advil, Motrin, Herbal medications, Fish Oil, Aleve   Do not wear jewelry, make-up or nail polish.  Do not wear lotions, powders, or perfumes.  You may wear deodorant.  Do not shave 48 hours prior to surgery.  Men may shave face and neck.  Do not bring valuables to the hospital.  [facility] is not responsible for any belongings or valuables.  Contacts, dentures or bridgework may not be worn into surgery.  Leave your suitcase in the car.  After surgery it may be brought to your room.  For patients admitted to the hospital, discharge time will be determined by your treatment team.  Patients discharged the day of surgery will not be allowed to drive home.    Special instructions:  [facility] - Preparing for Surgery  Before surgery, you can play an important role.  Because skin is not sterile, your skin needs to be as free of germs as possible.  You can reduce the number of germs on you skin by washing with CHG (chlorahexidine gluconate) soap before surgery.  CHG is an antiseptic cleaner which kills germs and bonds with the skin to continue killing germs even after washing.  Please DO NOT use if you have an allergy to CHG or antibacterial soaps.  If your skin becomes reddened/irritated stop using the CHG and inform your nurse when you arrive at Short Stay.  Do not  shave (including legs and underarms) for at least 48 hours prior to the first CHG shower.  You may shave your face.  Please follow these instructions carefully:   1.  Shower with CHG Soap the night before surgery and the                                morning of Surgery.  2.  If you choose to wash your hair, wash your hair first as usual with your       normal shampoo.  3.  After you shampoo, rinse your hair and body thoroughly to remove the                      Shampoo.  4.  Use CHG as you would any other liquid soap.  You can apply chg directly       to the skin and wash gently with scrungie or a clean washcloth.  5.  Apply the CHG Soap to your body ONLY FROM THE NECK DOWN.        Do not use on open wounds or open sores.  Avoid contact with your eyes,       ears, mouth and genitals (private parts).  Wash genitals (private parts)       with your normal soap.  6.  Wash thoroughly, paying special attention to the area where your surgery        will be performed.  7.  Thoroughly rinse your body with warm water from the neck down.  8.  DO NOT shower/wash with your normal soap after using and rinsing off       the CHG Soap.  9.  Pat yourself dry with a clean towel.            10.  Wear clean pajamas.            11.  Place clean sheets on your bed the night of your first shower and do not        sleep with pets.  Day of Surgery  Do not apply any lotions/deoderants the morning of surgery.  Please wear clean clothes to the hospital/surgery center.     Please read over the following fact sheets that you were given. Pain Booklet, Coughing and Deep Breathing, MRSA Information and Surgical Site Infection Prevention

## 2015-07-13 NOTE — Progress Notes (Addendum)
PCP is Dr Shirline Frees  States he saw a cardiologist many years ago, for trigeminy and bigeminy, but everything was ok. States he had a stress test last in 2014-request sent for results- states it was normal. Encouraged pt to bring in Cpap mask. Sig other Tam states that his bp dropped during Hemorrhoid surgery back last year at [facility], but has had carpel tunnel to his left hand since then and he did ok.- Requested anesthesia records from Dr Earlean Shawl- Dr Morton Stall was the Dr who did the procedure, but Tam states Dr, Earlean Shawl has the records and it would take a long time to get records from Dr Morton Stall.

## 2015-07-13 NOTE — Progress Notes (Signed)
Anesthesia Chart Review:  Pt is a 50 year old male scheduled for ORIF R clavicle fracture, R carpal tunnel release on 07/14/2015 with Dr. Marlou Sa.   PMH includes:  HTN, dysrhythmia (bigeminy and trigeminy; pt reports he was evaluated for this 5 years ago and no concerning findings), hyperlipidemia, TBI (2002), OSA, GERD. Never smoker. BMI 44.5  Anesthesia history: Pt reports experiencing hypotension during hemorrhoidectomy at [facility] 05/2014. Records in care everywhere reviewed.  There is no mention of hypotension in records, but graphical charting of BP perioperatively is not in care everywhere.   Medications include: lipitor, hctz, irbesartan, protonix  Preoperative labs reviewed.    CT chest 07/02/15:  1. Negative for acute traumatic injury in the abdomen or pelvis. 2. Right clavicle fracture. No other thoracic injury evident. 3. Bilateral L5 spondylolysis. 4. 1.7 cm left common iliac artery aneurysm. Probably unchanged from 08/25/2006.  EKG 07/01/15: Sinus rhythm. Baseline wander in lead(s) V3 V4 V5 V6  If no changes, I anticipate pt can proceed with surgery as scheduled.   Willeen Cass, FNP-BC [facility]/Anesthesiology Phone: [phone] 07/13/2015 3:20 PM

## 2015-07-14 ENCOUNTER — Ambulatory Visit (HOSPITAL_COMMUNITY): Payer: 59 | Admitting: Emergency Medicine

## 2015-07-14 ENCOUNTER — Ambulatory Visit (HOSPITAL_COMMUNITY): Payer: 59

## 2015-07-14 ENCOUNTER — Encounter (HOSPITAL_COMMUNITY)
Admission: RE | Disposition: A | Discharge status: Home / Self Care | Payer: Self-pay | Source: Ambulatory Visit | Attending: Orthopedic Surgery

## 2015-07-14 ENCOUNTER — Encounter (HOSPITAL_COMMUNITY): Payer: Self-pay | Admitting: *Deleted

## 2015-07-14 ENCOUNTER — Observation Stay (HOSPITAL_COMMUNITY)
Admission: RE | Admit: 2015-07-14 | Discharge: 2015-07-15 | Disposition: A | Discharge status: Home / Self Care | Payer: 59 | Source: Ambulatory Visit | Attending: Orthopedic Surgery | Admitting: Orthopedic Surgery

## 2015-07-14 ENCOUNTER — Ambulatory Visit (HOSPITAL_COMMUNITY): Payer: 59 | Admitting: Anesthesiology

## 2015-07-14 DIAGNOSIS — E785 Hyperlipidemia, unspecified: Secondary | ICD-10-CM | POA: Insufficient documentation

## 2015-07-14 DIAGNOSIS — Z419 Encounter for procedure for purposes other than remedying health state, unspecified: Secondary | ICD-10-CM

## 2015-07-14 DIAGNOSIS — Z87820 Personal history of traumatic brain injury: Secondary | ICD-10-CM | POA: Insufficient documentation

## 2015-07-14 DIAGNOSIS — S42009A Fracture of unspecified part of unspecified clavicle, initial encounter for closed fracture: Secondary | ICD-10-CM | POA: Diagnosis present

## 2015-07-14 DIAGNOSIS — I10 Essential (primary) hypertension: Secondary | ICD-10-CM | POA: Insufficient documentation

## 2015-07-14 DIAGNOSIS — G5601 Carpal tunnel syndrome, right upper limb: Secondary | ICD-10-CM | POA: Insufficient documentation

## 2015-07-14 DIAGNOSIS — S42001A Fracture of unspecified part of right clavicle, initial encounter for closed fracture: Secondary | ICD-10-CM | POA: Diagnosis not present

## 2015-07-14 HISTORY — PX: ORIF CLAVICLE FRACTURE: SUR924

## 2015-07-14 HISTORY — PX: CARPAL TUNNEL RELEASE: SHX101

## 2015-07-14 HISTORY — PX: ORIF CLAVICULAR FRACTURE: SHX5055

## 2015-07-14 SURGERY — OPEN REDUCTION INTERNAL FIXATION (ORIF) CLAVICULAR FRACTURE
Anesthesia: General | Laterality: Right

## 2015-07-14 MED ORDER — GLYCOPYRROLATE 0.2 MG/ML IJ SOLN
INTRAMUSCULAR | Status: AC
  Filled 2015-07-14: qty 3

## 2015-07-14 MED ORDER — METHOCARBAMOL 500 MG PO TABS
500.0000 mg | ORAL_TABLET | Freq: Four times a day (QID) | ORAL | Status: DC | PRN
  Administered 2015-07-14 – 2015-07-15 (×2): 500 mg via ORAL
  Filled 2015-07-14 (×2): qty 1

## 2015-07-14 MED ORDER — LACTATED RINGERS IV SOLN
INTRAVENOUS | Status: DC
  Administered 2015-07-14 (×2): via INTRAVENOUS

## 2015-07-14 MED ORDER — DEXAMETHASONE SODIUM PHOSPHATE 4 MG/ML IJ SOLN
INTRAMUSCULAR | Status: DC | PRN
  Administered 2015-07-14: 8 mg via INTRAVENOUS

## 2015-07-14 MED ORDER — SUCCINYLCHOLINE CHLORIDE 20 MG/ML IJ SOLN
INTRAMUSCULAR | Status: AC
  Filled 2015-07-14: qty 1

## 2015-07-14 MED ORDER — ASPIRIN EC 325 MG PO TBEC
325.0000 mg | DELAYED_RELEASE_TABLET | Freq: Every day | ORAL | Status: DC
  Administered 2015-07-14 – 2015-07-15 (×2): 325 mg via ORAL
  Filled 2015-07-14 (×2): qty 1

## 2015-07-14 MED ORDER — METOCLOPRAMIDE HCL 5 MG/ML IJ SOLN: 5.0000 mg | Freq: Three times a day (TID) | INTRAMUSCULAR | Status: DC | PRN

## 2015-07-14 MED ORDER — ARTIFICIAL TEARS OP OINT
TOPICAL_OINTMENT | OPHTHALMIC | Status: AC
  Filled 2015-07-14: qty 3.5

## 2015-07-14 MED ORDER — GLYCOPYRROLATE 0.2 MG/ML IJ SOLN
INTRAMUSCULAR | Status: DC | PRN
  Administered 2015-07-14: 0.6 mg via INTRAVENOUS
  Administered 2015-07-14: 0.2 mg via INTRAVENOUS

## 2015-07-14 MED ORDER — ONDANSETRON HCL 4 MG/2ML IJ SOLN: 4.0000 mg | Freq: Four times a day (QID) | INTRAMUSCULAR | Status: DC | PRN

## 2015-07-14 MED ORDER — ACETAMINOPHEN 325 MG PO TABS: 650.0000 mg | ORAL_TABLET | Freq: Four times a day (QID) | ORAL | Status: DC | PRN

## 2015-07-14 MED ORDER — HYDROMORPHONE HCL 1 MG/ML IJ SOLN
0.2500 mg | INTRAMUSCULAR | Status: DC | PRN
  Administered 2015-07-14 (×4): 0.5 mg via INTRAVENOUS

## 2015-07-14 MED ORDER — PROPOFOL 10 MG/ML IV BOLUS
INTRAVENOUS | Status: DC | PRN
  Administered 2015-07-14: 200 mg via INTRAVENOUS

## 2015-07-14 MED ORDER — EPHEDRINE SULFATE 50 MG/ML IJ SOLN
INTRAMUSCULAR | Status: AC
  Filled 2015-07-14: qty 1

## 2015-07-14 MED ORDER — LIDOCAINE HCL (CARDIAC) 20 MG/ML IV SOLN
INTRAVENOUS | Status: AC
  Filled 2015-07-14: qty 15

## 2015-07-14 MED ORDER — ONDANSETRON HCL 4 MG/2ML IJ SOLN
INTRAMUSCULAR | Status: AC
  Filled 2015-07-14: qty 2

## 2015-07-14 MED ORDER — HYDROCHLOROTHIAZIDE 25 MG PO TABS
25.0000 mg | ORAL_TABLET | Freq: Every day | ORAL | Status: DC
  Administered 2015-07-14 – 2015-07-15 (×2): 25 mg via ORAL
  Filled 2015-07-14 (×2): qty 1

## 2015-07-14 MED ORDER — FENTANYL CITRATE (PF) 250 MCG/5ML IJ SOLN
INTRAMUSCULAR | Status: AC
  Filled 2015-07-14: qty 5

## 2015-07-14 MED ORDER — MIDAZOLAM HCL 2 MG/2ML IJ SOLN
INTRAMUSCULAR | Status: AC
  Filled 2015-07-14: qty 2

## 2015-07-14 MED ORDER — METHOCARBAMOL 1000 MG/10ML IJ SOLN: 500.0000 mg | Freq: Four times a day (QID) | INTRAVENOUS | Status: DC | PRN

## 2015-07-14 MED ORDER — ROCURONIUM BROMIDE 50 MG/5ML IV SOLN
INTRAVENOUS | Status: AC
  Filled 2015-07-14: qty 1

## 2015-07-14 MED ORDER — PROPOFOL 10 MG/ML IV BOLUS
INTRAVENOUS | Status: AC
  Filled 2015-07-14: qty 20

## 2015-07-14 MED ORDER — PHENYLEPHRINE HCL 10 MG/ML IJ SOLN
INTRAMUSCULAR | Status: DC | PRN
  Administered 2015-07-14: 80 ug via INTRAVENOUS

## 2015-07-14 MED ORDER — CHLORHEXIDINE GLUCONATE 4 % EX LIQD: 60.0000 mL | Freq: Once | CUTANEOUS | Status: DC

## 2015-07-14 MED ORDER — POTASSIUM CHLORIDE IN NACL 20-0.9 MEQ/L-% IV SOLN
INTRAVENOUS | Status: DC
  Administered 2015-07-15: 06:00:00 via INTRAVENOUS
  Filled 2015-07-14 (×2): qty 1000

## 2015-07-14 MED ORDER — CEFAZOLIN SODIUM-DEXTROSE 2-4 GM/100ML-% IV SOLN
2.0000 g | Freq: Four times a day (QID) | INTRAVENOUS | Status: AC
Start: 2015-07-14 — End: 2015-07-14
  Administered 2015-07-14 (×2): 2 g via INTRAVENOUS
  Filled 2015-07-14 (×2): qty 100

## 2015-07-14 MED ORDER — HYDROMORPHONE HCL 1 MG/ML IJ SOLN
INTRAMUSCULAR | Status: AC
  Administered 2015-07-14: 0.5 mg via INTRAVENOUS
  Filled 2015-07-14: qty 1

## 2015-07-14 MED ORDER — NEOSTIGMINE METHYLSULFATE 10 MG/10ML IV SOLN
INTRAVENOUS | Status: AC
  Filled 2015-07-14: qty 1

## 2015-07-14 MED ORDER — LIDOCAINE HCL (CARDIAC) 20 MG/ML IV SOLN
INTRAVENOUS | Status: DC | PRN
  Administered 2015-07-14: 60 mg via INTRATRACHEAL
  Administered 2015-07-14: 60 mg via INTRAVENOUS

## 2015-07-14 MED ORDER — BUPIVACAINE HCL (PF) 0.25 % IJ SOLN
INTRAMUSCULAR | Status: AC
  Filled 2015-07-14: qty 30

## 2015-07-14 MED ORDER — NEOSTIGMINE METHYLSULFATE 10 MG/10ML IV SOLN
INTRAVENOUS | Status: DC | PRN
  Administered 2015-07-14: 4 mg via INTRAVENOUS

## 2015-07-14 MED ORDER — EPHEDRINE SULFATE 50 MG/ML IJ SOLN
INTRAMUSCULAR | Status: DC | PRN
  Administered 2015-07-14: 15 mg via INTRAVENOUS

## 2015-07-14 MED ORDER — NON FORMULARY: 1.0000 [IU] | Freq: Every day | Status: DC

## 2015-07-14 MED ORDER — PHENYLEPHRINE HCL 10 MG/ML IJ SOLN
10.0000 mg | INTRAVENOUS | Status: DC | PRN
  Administered 2015-07-14: 40 ug/min via INTRAVENOUS

## 2015-07-14 MED ORDER — SACCHAROMYCES BOULARDII 250 MG PO CAPS
250.0000 mg | ORAL_CAPSULE | Freq: Every day | ORAL | Status: DC
  Administered 2015-07-14 – 2015-07-15 (×2): 250 mg via ORAL
  Filled 2015-07-14 (×2): qty 1

## 2015-07-14 MED ORDER — 0.9 % SODIUM CHLORIDE (POUR BTL) OPTIME
TOPICAL | Status: DC | PRN
  Administered 2015-07-14 (×3): 1000 mL

## 2015-07-14 MED ORDER — ONDANSETRON HCL 4 MG PO TABS: 4.0000 mg | ORAL_TABLET | Freq: Four times a day (QID) | ORAL | Status: DC | PRN

## 2015-07-14 MED ORDER — MENTHOL 3 MG MT LOZG: 1.0000 | LOZENGE | OROMUCOSAL | Status: DC | PRN

## 2015-07-14 MED ORDER — ONDANSETRON HCL 4 MG/2ML IJ SOLN
INTRAMUSCULAR | Status: DC | PRN
  Administered 2015-07-14: 4 mg via INTRAVENOUS

## 2015-07-14 MED ORDER — OXYCODONE HCL 5 MG PO TABS
5.0000 mg | ORAL_TABLET | ORAL | Status: DC | PRN
  Administered 2015-07-14 – 2015-07-15 (×6): 10 mg via ORAL
  Filled 2015-07-14 (×2): qty 2
  Filled 2015-07-14: qty 1
  Filled 2015-07-14 (×4): qty 2

## 2015-07-14 MED ORDER — ACETAMINOPHEN 650 MG RE SUPP: 650.0000 mg | Freq: Four times a day (QID) | RECTAL | Status: DC | PRN

## 2015-07-14 MED ORDER — FENTANYL CITRATE (PF) 100 MCG/2ML IJ SOLN
INTRAMUSCULAR | Status: DC | PRN
  Administered 2015-07-14: 150 ug via INTRAVENOUS
  Administered 2015-07-14 (×2): 50 ug via INTRAVENOUS

## 2015-07-14 MED ORDER — PANTOPRAZOLE SODIUM 40 MG PO TBEC
40.0000 mg | DELAYED_RELEASE_TABLET | Freq: Every day | ORAL | Status: DC
  Administered 2015-07-15: 40 mg via ORAL
  Filled 2015-07-14: qty 1

## 2015-07-14 MED ORDER — STERILE WATER FOR INJECTION IJ SOLN
INTRAMUSCULAR | Status: AC
  Filled 2015-07-14: qty 10

## 2015-07-14 MED ORDER — ROCURONIUM BROMIDE 100 MG/10ML IV SOLN
INTRAVENOUS | Status: DC | PRN
  Administered 2015-07-14: 50 mg via INTRAVENOUS

## 2015-07-14 MED ORDER — HYDROMORPHONE HCL 1 MG/ML IJ SOLN
1.0000 mg | INTRAMUSCULAR | Status: DC | PRN
  Administered 2015-07-14: 1 mg via INTRAVENOUS
  Filled 2015-07-14: qty 1

## 2015-07-14 MED ORDER — BUPIVACAINE HCL (PF) 0.25 % IJ SOLN
INTRAMUSCULAR | Status: DC | PRN
  Administered 2015-07-14: 36 mL

## 2015-07-14 MED ORDER — FENTANYL CITRATE (PF) 100 MCG/2ML IJ SOLN
INTRAMUSCULAR | Status: AC
  Filled 2015-07-14: qty 2

## 2015-07-14 MED ORDER — PHENYLEPHRINE 40 MCG/ML (10ML) SYRINGE FOR IV PUSH (FOR BLOOD PRESSURE SUPPORT)
PREFILLED_SYRINGE | INTRAVENOUS | Status: AC
Start: 2015-07-14 — End: 2015-07-14
  Filled 2015-07-14: qty 10

## 2015-07-14 MED ORDER — PHENOL 1.4 % MT LIQD: 1.0000 | OROMUCOSAL | Status: DC | PRN

## 2015-07-14 MED ORDER — IRBESARTAN 300 MG PO TABS
300.0000 mg | ORAL_TABLET | Freq: Every day | ORAL | Status: DC
  Administered 2015-07-14 – 2015-07-15 (×2): 300 mg via ORAL
  Filled 2015-07-14 (×2): qty 1

## 2015-07-14 MED ORDER — METOCLOPRAMIDE HCL 5 MG PO TABS
5.0000 mg | ORAL_TABLET | Freq: Three times a day (TID) | ORAL | Status: DC | PRN
Start: 2015-07-14 — End: 2015-07-15

## 2015-07-14 MED ORDER — ATORVASTATIN CALCIUM 40 MG PO TABS
40.0000 mg | ORAL_TABLET | Freq: Every day | ORAL | Status: DC
  Administered 2015-07-14 – 2015-07-15 (×2): 40 mg via ORAL
  Filled 2015-07-14 (×2): qty 1

## 2015-07-14 MED ORDER — OXYCODONE HCL 5 MG/5ML PO SOLN: 5.0000 mg | Freq: Once | ORAL | Status: DC | PRN

## 2015-07-14 MED ORDER — SUCCINYLCHOLINE CHLORIDE 20 MG/ML IJ SOLN
INTRAMUSCULAR | Status: DC | PRN
  Administered 2015-07-14: 140 mg via INTRAVENOUS

## 2015-07-14 MED ORDER — MIDAZOLAM HCL 5 MG/5ML IJ SOLN
INTRAMUSCULAR | Status: DC | PRN
  Administered 2015-07-14: 2 mg via INTRAVENOUS

## 2015-07-14 MED ORDER — OXYCODONE HCL 5 MG PO TABS: 5.0000 mg | ORAL_TABLET | Freq: Once | ORAL | Status: DC | PRN

## 2015-07-14 MED ORDER — ADULT MULTIVITAMIN W/MINERALS CH
1.0000 | ORAL_TABLET | Freq: Every day | ORAL | Status: DC
  Administered 2015-07-14 – 2015-07-15 (×2): 1 via ORAL
  Filled 2015-07-14 (×2): qty 1

## 2015-07-14 SURGICAL SUPPLY — 87 items
BANDAGE ACE 4X5 VEL STRL LF (GAUZE/BANDAGES/DRESSINGS) ×2 IMPLANT
BANDAGE ELASTIC 3 VELCRO ST LF (GAUZE/BANDAGES/DRESSINGS) ×2 IMPLANT
BANDAGE ELASTIC 4 VELCRO ST LF (GAUZE/BANDAGES/DRESSINGS) ×2 IMPLANT
BENZOIN TINCTURE PRP APPL 2/3 (GAUZE/BANDAGES/DRESSINGS) ×2 IMPLANT
BIT DRILL 2 CANN GRADUATED (BIT) ×2 IMPLANT
BIT DRILL 2.5 CANN LNG (BIT) ×2 IMPLANT
BIT DRILL 2.7 (BIT) ×1
BIT DRILL 2.7X2.7/3XSCR ANKL (BIT) ×1 IMPLANT
BIT DRL 2.7X2.7/3XSCR ANKL (BIT) ×1
BNDG ESMARK 4X9 LF (GAUZE/BANDAGES/DRESSINGS) ×2 IMPLANT
BNDG GAUZE ELAST 4 BULKY (GAUZE/BANDAGES/DRESSINGS) ×2 IMPLANT
CORDS BIPOLAR (ELECTRODE) ×2 IMPLANT
COVER SURGICAL LIGHT HANDLE (MISCELLANEOUS) ×4 IMPLANT
CUFF TOURNIQUET SINGLE 24IN (TOURNIQUET CUFF) ×2 IMPLANT
DECANTER SPIKE VIAL GLASS SM (MISCELLANEOUS) ×2 IMPLANT
DRAIN PENROSE 1/2X12 LTX STRL (WOUND CARE) IMPLANT
DRAPE C-ARM 42X72 X-RAY (DRAPES) ×2 IMPLANT
DRAPE INCISE IOBAN 66X45 STRL (DRAPES) ×4 IMPLANT
DRAPE U-SHAPE 47X51 STRL (DRAPES) ×4 IMPLANT
DRSG AQUACEL AG ADV 3.5X10 (GAUZE/BANDAGES/DRESSINGS) ×2 IMPLANT
DRSG MEPILEX BORDER 4X12 (GAUZE/BANDAGES/DRESSINGS) IMPLANT
DRSG MEPILEX BORDER 4X8 (GAUZE/BANDAGES/DRESSINGS) ×2 IMPLANT
DRSG PAD ABDOMINAL 8X10 ST (GAUZE/BANDAGES/DRESSINGS) IMPLANT
DURAPREP 26ML APPLICATOR (WOUND CARE) ×2 IMPLANT
ELECT REM PT RETURN 9FT ADLT (ELECTROSURGICAL) ×2
ELECTRODE REM PT RTRN 9FT ADLT (ELECTROSURGICAL) ×1 IMPLANT
GAUZE SPONGE 4X4 12PLY STRL (GAUZE/BANDAGES/DRESSINGS) ×2 IMPLANT
GAUZE XEROFORM 1X8 LF (GAUZE/BANDAGES/DRESSINGS) ×2 IMPLANT
GAUZE XEROFORM 5X9 LF (GAUZE/BANDAGES/DRESSINGS) IMPLANT
GLOVE BIO SURGEON ST LM GN SZ9 (GLOVE) ×2 IMPLANT
GLOVE BIOGEL PI IND STRL 7.5 (GLOVE) ×1 IMPLANT
GLOVE BIOGEL PI IND STRL 8 (GLOVE) ×1 IMPLANT
GLOVE BIOGEL PI INDICATOR 7.5 (GLOVE) ×1
GLOVE BIOGEL PI INDICATOR 8 (GLOVE) ×1
GLOVE SURG ORTHO 8.0 STRL STRW (GLOVE) ×2 IMPLANT
GLOVE SURG SS PI 6.5 STRL IVOR (GLOVE) ×4 IMPLANT
GOWN STRL REUS W/ TWL LRG LVL3 (GOWN DISPOSABLE) ×2 IMPLANT
GOWN STRL REUS W/ TWL XL LVL3 (GOWN DISPOSABLE) ×1 IMPLANT
GOWN STRL REUS W/TWL LRG LVL3 (GOWN DISPOSABLE) ×2
GOWN STRL REUS W/TWL XL LVL3 (GOWN DISPOSABLE) ×1
HOVERMATT SINGLE USE (MISCELLANEOUS) ×2 IMPLANT
KIT BASIN OR (CUSTOM PROCEDURE TRAY) ×2 IMPLANT
KIT ROOM TURNOVER OR (KITS) ×2 IMPLANT
LOOP VESSEL MAXI BLUE (MISCELLANEOUS) IMPLANT
MANIFOLD NEPTUNE II (INSTRUMENTS) ×2 IMPLANT
NEEDLE 21X1 OR PACK (NEEDLE) ×2 IMPLANT
NEEDLE HYPO 25X1 1.5 SAFETY (NEEDLE) ×2 IMPLANT
NS IRRIG 1000ML POUR BTL (IV SOLUTION) ×2 IMPLANT
PACK SHOULDER (CUSTOM PROCEDURE TRAY) ×2 IMPLANT
PAD ARMBOARD 7.5X6 YLW CONV (MISCELLANEOUS) ×4 IMPLANT
PAD CAST 4YDX4 CTTN HI CHSV (CAST SUPPLIES) ×1 IMPLANT
PADDING CAST COTTON 4X4 STRL (CAST SUPPLIES) ×1
PLATE DISTAL CLAVICLE 10H RT (Plate) ×2 IMPLANT
SCREW CORTEX LP TM SS 2.7X16 (Screw) ×2 IMPLANT
SCREW LOCK T10 FT 18X2.7X (Screw) ×2 IMPLANT
SCREW LOCK T15 FT 14X3.5XST (Screw) ×1 IMPLANT
SCREW LOCKING 2.7X14MM (Screw) ×4 IMPLANT
SCREW LOCKING 2.7X16MM (Screw) ×2 IMPLANT
SCREW LOCKING 2.7X18MM (Screw) ×2 IMPLANT
SCREW LOCKING 3.5X14MM (Screw) ×1 IMPLANT
SCREW LOW PROFILE 3.5X14 (Screw) ×2 IMPLANT
SCREW LOW PROFILE 3.5X16 (Screw) ×2 IMPLANT
SCREW NLOCK T15 FT 18X3.5XST (Screw) ×3 IMPLANT
SCREW NON LOCK 3.5X18MM (Screw) ×3 IMPLANT
SCREW NON-LOCKING 3.5X20 ANKLE (Screw) ×2 IMPLANT
SLING ARM IMMOBILIZER LRG (SOFTGOODS) IMPLANT
SPLINT PLASTER CAST XFAST 4X15 (CAST SUPPLIES) ×1 IMPLANT
SPLINT PLASTER XTRA FAST SET 4 (CAST SUPPLIES) ×1
SPONGE GAUZE 4X4 12PLY STER LF (GAUZE/BANDAGES/DRESSINGS) ×2 IMPLANT
SPONGE LAP 18X18 X RAY DECT (DISPOSABLE) ×2 IMPLANT
SPONGE LAP 4X18 X RAY DECT (DISPOSABLE) ×4 IMPLANT
SUT ETHILON 3 0 PS 1 (SUTURE) IMPLANT
SUT MNCRL AB 3-0 PS2 18 (SUTURE) ×2 IMPLANT
SUT VIC AB 1 CT1 27 (SUTURE) ×3
SUT VIC AB 1 CT1 27XBRD ANBCTR (SUTURE) ×3 IMPLANT
SUT VIC AB 2-0 CT1 27 (SUTURE) ×2
SUT VIC AB 2-0 CT1 TAPERPNT 27 (SUTURE) ×2 IMPLANT
SUT VIC AB 2-0 CTB1 (SUTURE) IMPLANT
SUT VIC AB 3-0 FS2 27 (SUTURE) IMPLANT
SYR CONTROL 10ML LL (SYRINGE) IMPLANT
SYSTEM CHEST DRAIN TLS 7FR (DRAIN) IMPLANT
TOWEL OR 17X24 6PK STRL BLUE (TOWEL DISPOSABLE) ×2 IMPLANT
TOWEL OR 17X26 10 PK STRL BLUE (TOWEL DISPOSABLE) ×2 IMPLANT
TUBE CONNECTING 12X1/4 (SUCTIONS) IMPLANT
UNDERPAD 30X30 INCONTINENT (UNDERPADS AND DIAPERS) ×2 IMPLANT
WATER STERILE IRR 1000ML POUR (IV SOLUTION) ×2 IMPLANT
YANKAUER SUCT BULB TIP NO VENT (SUCTIONS) ×2 IMPLANT

## 2015-07-14 NOTE — Anesthesia Procedure Notes (Signed)
Procedure Name: Intubation Date/Time: 07/14/2015 10:51 AM Performed by: [name] Pre-anesthesia Checklist: Patient identified, Emergency Drugs available, Suction available, Patient being monitored and Timeout performed Patient Re-evaluated:Patient Re-evaluated prior to inductionOxygen Delivery Method: Circle system utilized Preoxygenation: Pre-oxygenation with 100% oxygen Intubation Type: IV induction Laryngoscope Size: Mac and 4 Grade View: Grade II Tube type: Oral Tube size: 7.5 mm Number of attempts: 1 Airway Equipment and Method: Stylet and LTA kit utilized Placement Confirmation: ETT inserted through vocal cords under direct vision,  positive ETCO2 and breath sounds checked- equal and bilateral Secured at: 22 cm Tube secured with: Tape Dental Injury: Teeth and Oropharynx as per pre-operative assessment

## 2015-07-14 NOTE — Transfer of Care (Signed)
Immediate Anesthesia Transfer of Care Note  Patient: Adam Stephenson  Procedure(s) Performed: Procedure(s): OPEN REDUCTION INTERNAL FIXATION (ORIF) RIGHT CLAVICLE FRACTURE, RIGHT CARPAL TUNNEL RELEASE (Right) CARPAL TUNNEL RELEASE (Right)  Patient Location: PACU  Anesthesia Type:General  Level of Consciousness: awake, alert , oriented, patient cooperative and responds to stimulation  Airway & Oxygen Therapy: Patient Spontanous Breathing and Patient connected to nasal cannula oxygen  Post-op Assessment: Report given to RN, Post -op Vital signs reviewed and stable and Patient moving all extremities X 4  Post vital signs: Reviewed and stable  Last Vitals:  Filed Vitals:   07/14/15 0823  BP: 133/78  Pulse: 76  Temp: 36.8 C  Resp: 18    Complications: No apparent anesthesia complications
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
## 2015-07-14 NOTE — Brief Op Note (Signed)
07/14/2015  2:52 PM  PATIENT:  [name]  50 y.o. male  PRE-OPERATIVE DIAGNOSIS:  right clavicle fracture, right carpal tunnel syndrome  POST-OPERATIVE DIAGNOSIS:  right clavicle fracture, right carpal tunnel syndrome  PROCEDURE:  Procedure(s): OPEN REDUCTION INTERNAL FIXATION (ORIF) RIGHT CLAVICLE FRACTURE, RIGHT CARPAL TUNNEL RELEASE CARPAL TUNNEL RELEASE  SURGEON:  Surgeon(s): Meredith Pel, MD  ASSISTANT: Laure Kidney RNFA  ANESTHESIA:   general  EBL: 75 ml    Total I/O In: 1700 [I.V.:1700] Out: 350 [Urine:350]  BLOOD ADMINISTERED: none  DRAINS: none   LOCAL MEDICATIONS USED:  none  SPECIMEN:  No Specimen  COUNTS:  YES  TOURNIQUET:  * Missing tourniquet times found for documented tourniquets in log:  [id number] *  DICTATION: .Other Dictation: Dictation Number [id number]  PLAN OF CARE: Admit for overnight observation  PATIENT DISPOSITION:  PACU - hemodynamically stable

## 2015-07-14 NOTE — Progress Notes (Signed)
Pt arrived to 5C08 @ 1633, Pt A&Ox 4, c/o pain 3/10. Dressing CDI, no drains. Pt VS taken, pt on O2 from PACU, Fluids runningat 75 cc/hr. Pt without distress. Family at the bedside. Diet ordered, will monitor.

## 2015-07-14 NOTE — Anesthesia Preprocedure Evaluation (Signed)
Anesthesia Evaluation  Patient identified by MRN, date of birth, ID band Patient awake    Reviewed: Allergy & Precautions, NPO status , Patient's Chart, lab work & pertinent test results  Airway Mallampati: II   Neck ROM: full    Dental   Pulmonary sleep apnea ,    breath sounds clear to auscultation       Cardiovascular hypertension,  Rhythm:regular Rate:Normal     Neuro/Psych  Headaches, Anxiety Depression    GI/Hepatic GERD  ,  Endo/Other  Morbid obesity  Renal/GU      Musculoskeletal   Abdominal   Peds  Hematology   Anesthesia Other Findings   Reproductive/Obstetrics                             Anesthesia Physical Anesthesia Plan  ASA: II  Anesthesia Plan: General   Post-op Pain Management:    Induction: Intravenous  Airway Management Planned: Oral ETT  Additional Equipment:   Intra-op Plan:   Post-operative Plan: Extubation in OR  Informed Consent: I have reviewed the patients History and Physical, chart, labs and discussed the procedure including the risks, benefits and alternatives for the proposed anesthesia with the patient or authorized representative who has indicated his/her understanding and acceptance.     Plan Discussed with: CRNA, Anesthesiologist and Surgeon  Anesthesia Plan Comments:         Anesthesia Quick Evaluation

## 2015-07-15 DIAGNOSIS — S42001A Fracture of unspecified part of right clavicle, initial encounter for closed fracture: Secondary | ICD-10-CM | POA: Diagnosis not present

## 2015-07-15 MED ORDER — OXYCODONE HCL 5 MG PO TABS: 5.0000 mg | ORAL_TABLET | Freq: Two times a day (BID) | ORAL | Status: DC | PRN

## 2015-07-15 MED ORDER — METHOCARBAMOL 500 MG PO TABS: 500.0000 mg | ORAL_TABLET | Freq: Four times a day (QID) | ORAL | Status: DC | PRN
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
## 2015-07-15 NOTE — Progress Notes (Signed)
Patient d/c home. D/c instructions given and patient verbalized understanding. 

## 2015-07-15 NOTE — Care Management Note (Signed)
Case Management Note  Patient Details  Name: Adam Stephenson MRN: [id number] Date of Birth: 1965-10-10  Subjective/Objective:                    Action/Plan: Patient presented with Right shoulder and hand pain.  Lives at home with spouse. Patient is being discharged home today. No home health needs identified this hospitalization.  Expected Discharge Date:                  Expected Discharge Plan:  Home/Self Care  In-House Referral:     Discharge planning Services     Post Acute Care Choice:    Choice offered to:     DME Arranged:    DME Agency:     HH Arranged:    [place] Agency:     Status of Service:  Completed, signed off  Medicare Important Message Given:    Date Medicare IM Given:    Medicare IM give by:    Date Additional Medicare IM Given:    Additional Medicare Important Message give by:     If discussed at [place] of Stay Meetings, dates discussed:    Additional Comments:  Rolm Baptise, RN 07/15/2015, 10:36 AM [phone]

## 2015-07-15 NOTE — Progress Notes (Signed)
Subjective: Pt stable   Objective: Vital signs in last 24 hours: Temp:  [97.5 F (36.4 C)-98.6 F (37 C)] 97.9 F (36.6 C) (04/26 0533) Pulse Rate:  [76-107] 105 (04/26 0533) Resp:  [16-20] 16 (04/26 0533) BP: (114-133)/(65-78) 115/65 mmHg (04/26 0533) SpO2:  [90 %-99 %] 90 % (04/26 0533) Weight:  [148.326 kg (327 lb)] 148.326 kg (327 lb) (04/25 0823)  Intake/Output from previous day: 04/25 0701 - 04/26 0700 In: 1700 [I.V.:1700] Out: 550 [Urine:550] Intake/Output this shift:    Exam:  Sensation intact distally Intact pulses distally  Labs:  Recent Labs  07/13/15 1328  HGB 15.1    Recent Labs  07/13/15 1328  WBC 10.1  RBC 4.85  HCT 43.8  PLT 265    Recent Labs  07/13/15 1328  NA 139  K 3.9  CL 100*  CO2 28  BUN 16  CREATININE 1.05  GLUCOSE 105*  CALCIUM 9.6   No results for input(s): LABPT, INR in the last 72 hours.  Assessment/Plan: Plan dc today   Adam Stephenson 07/15/2015, 7:22 AM
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
## 2015-07-16 NOTE — Anesthesia Postprocedure Evaluation (Signed)
Anesthesia Post Note  Patient: Adam Stephenson  Procedure(s) Performed: Procedure(s) (LRB): OPEN REDUCTION INTERNAL FIXATION (ORIF) RIGHT CLAVICLE FRACTURE, RIGHT CARPAL TUNNEL RELEASE (Right) CARPAL TUNNEL RELEASE (Right)  Patient location during evaluation: PACU Anesthesia Type: General Level of consciousness: awake and alert and patient cooperative Pain management: pain level controlled Vital Signs Assessment: post-procedure vital signs reviewed and stable Respiratory status: spontaneous breathing and respiratory function stable Cardiovascular status: stable Anesthetic complications: no    Last Vitals:  Filed Vitals:   07/15/15 0533 07/15/15 1024  BP: 115/65 110/67  Pulse: 105 94  Temp: 36.6 C 36.8 C  Resp: 16 18    Last Pain:  Filed Vitals:   07/15/15 1223  PainSc: [place]

## 2015-07-17 ENCOUNTER — Encounter (HOSPITAL_COMMUNITY): Payer: Self-pay | Admitting: Orthopedic Surgery

## 2015-07-22 ENCOUNTER — Encounter (HOSPITAL_COMMUNITY): Payer: Self-pay | Admitting: Orthopedic Surgery

## 2015-07-28 ENCOUNTER — Encounter (HOSPITAL_COMMUNITY): Payer: Self-pay | Admitting: Orthopedic Surgery

## 2015-09-25 ENCOUNTER — Other Ambulatory Visit: Payer: Self-pay | Admitting: Orthopedic Surgery

## 2015-10-26 ENCOUNTER — Inpatient Hospital Stay (HOSPITAL_COMMUNITY): Admission: RE | Admit: 2015-10-26 | Payer: 59 | Source: Ambulatory Visit

## 2015-11-30 ENCOUNTER — Inpatient Hospital Stay (HOSPITAL_COMMUNITY): Admission: RE | Admit: 2015-11-30 | Payer: 59 | Source: Ambulatory Visit

## 2015-12-01 ENCOUNTER — Other Ambulatory Visit: Payer: Self-pay | Admitting: Orthopedic Surgery

## 2015-12-28 ENCOUNTER — Ambulatory Visit (INDEPENDENT_AMBULATORY_CARE_PROVIDER_SITE_OTHER): Payer: 59 | Admitting: Podiatry

## 2015-12-28 ENCOUNTER — Encounter: Payer: Self-pay | Admitting: Podiatry

## 2015-12-28 DIAGNOSIS — B351 Tinea unguium: Secondary | ICD-10-CM | POA: Diagnosis not present

## 2015-12-28 DIAGNOSIS — M79674 Pain in right toe(s): Secondary | ICD-10-CM

## 2015-12-28 DIAGNOSIS — B353 Tinea pedis: Secondary | ICD-10-CM | POA: Diagnosis not present

## 2015-12-28 DIAGNOSIS — M79675 Pain in left toe(s): Secondary | ICD-10-CM | POA: Diagnosis not present

## 2015-12-28 MED ORDER — NONFORMULARY OR COMPOUNDED ITEM: 2 refills | Status: DC

## 2015-12-28 MED ORDER — CLOTRIMAZOLE-BETAMETHASONE 1-0.05 % EX CREA: 1.0000 "application " | TOPICAL_CREAM | Freq: Two times a day (BID) | CUTANEOUS | 0 refills | Status: DC

## 2015-12-28 NOTE — Progress Notes (Signed)
Subjective: 51 year old male presents the office today for concerns of both of his big toenails becoming thick and discolored. He was on kerydin for about a year but he had not seen much relief. He also has athlete's foot he was using Naftin for this but has not been helping. States his feet itch may swell a lot. Denies any surrounding redness or drainage from the toenails. Denies any systemic complaints such as fevers, chills, nausea, vomiting. No acute changes since last appointment, and no other complaints at this time.   Objective: AAO x3, NAD DP/PT pulses palpable bilaterally, CRT less than 3 seconds Bilateral hallux nails are somewhat dystrophic, discolored, hypertrophic there is white discoloration. The right hallux toenail does have some subungual debris underneath toenail. There is tenderness to bilateral hallux toenails without any surrounding redness or drainage or any swelling. There is no clinical signs of infection. Mild erythematous changes interdigitally with pealing skin consistent with tinea pedis. No other open lesions or pre-ulcerative lesions. No other areas of tenderness bilaterally. No open lesions or pre-ulcerative lesions.  No pain with calf compression, swelling, warmth, erythema  Assessment: 50 year old male cinematic onychomycosis, tinea pedis  Plan: -All treatment options discussed with the patient including all alternatives, risks, complications.  -Nail sharply debrided 2 without complications or bleeding today. Prescribed compound cream for onychomycosis. -Prescribed Lotrisone for tinea pedis -Daily foot inspection -Follow-up in 3 months or sooner if needed. -Patient encouraged to call the office with any questions, concerns, change in symptoms.   Celesta Gentile, DPM

## 2016-01-22 ENCOUNTER — Encounter (HOSPITAL_COMMUNITY)
Admission: RE | Admit: 2016-01-22 | Discharge: 2016-01-22 | Disposition: A | Discharge status: Home / Self Care | Payer: 59 | Source: Ambulatory Visit | Attending: Orthopedic Surgery | Admitting: Orthopedic Surgery

## 2016-01-22 ENCOUNTER — Encounter (HOSPITAL_COMMUNITY): Payer: Self-pay

## 2016-01-22 DIAGNOSIS — Z01812 Encounter for preprocedural laboratory examination: Secondary | ICD-10-CM | POA: Insufficient documentation

## 2016-01-22 HISTORY — DX: Personal history of urinary calculi: Z87.442

## 2016-01-22 HISTORY — DX: Pityriasis versicolor: B36.0

## 2016-01-22 LAB — BASIC METABOLIC PANEL
ANION GAP: 7 (ref 5–15)
BUN: 14 mg/dL (ref 6–20)
CO2: 25 mmol/L (ref 22–32)
Calcium: 9.4 mg/dL (ref 8.9–10.3)
Chloride: 106 mmol/L (ref 101–111)
Creatinine, Ser: 0.93 mg/dL (ref 0.61–1.24)
GFR calc Af Amer: >60 mL/min (ref >60)
GLUCOSE: 99 mg/dL (ref 65–99)
Potassium: 3.7 mmol/L (ref 3.5–5.1)
Sodium: 138 mmol/L (ref 135–145)

## 2016-01-22 LAB — CBC
HCT: 41.8 % (ref 39.0–52.0)
HEMOGLOBIN: 14.3 g/dL (ref 13.0–17.0)
MCH: 30.6 pg (ref 26.0–34.0)
MCHC: 34.2 g/dL (ref 30.0–36.0)
MCV: 89.5 fL (ref 78.0–100.0)
Platelets: 217 10*3/uL (ref 150–400)
RBC: 4.67 MIL/uL (ref 4.22–5.81)
RDW: 13.4 % (ref 11.5–15.5)
WBC: 6.7 10*3/uL (ref 4.0–10.5)

## 2016-01-22 NOTE — Pre-Procedure Instructions (Signed)
    Adam Stephenson  01/22/2016   Your procedure is scheduled on Monday, November 13.  Report to [facility] Admitting at 8:55 AM               Your surgery or procedure is scheduled for 10:55 AM   Call this number if you have problems the morning of surgery:[phone]             For any other questions, please call [phone], Monday - Friday 8 AM - 4 PM.   Remember:  Do not eat food or drink liquids after midnight Sunday, November 12.  Take these medicines the morning of surgery with A SIP OF WATER :atorvastatin (LIPITOR), pantoprazole (PROTONIX).                 Take if needed: if you tolerate it on an empty stomach- methocarbamol (ROBAXIN), oxyCODONE (ROXICODONE).               1 Week prior to surgery STOP taking Aspirin , Aspirin Products (Goody Powder, Excedrin Migraine), Ibuprofen (Advil), Naproxen (Aleve), Vitamins and Herbal Products (ie Fish Oil)   Do not wear jewelry, make-up or nail polish.  Do not wear lotions, powders, or perfumes, or deodorant.  Men may shave face and neck.  Do not bring valuables to the hospital.  [facility] is not responsible for any belongings or valuables.  Contacts, dentures or bridgework may not be worn into surgery.  Leave your suitcase in the car.  After surgery it may be brought to your room.  For patients admitted to the hospital, discharge time will be determined by your treatment team.  Patients discharged the day of surgery will not be allowed to drive home.   Special instructions:  Bring CPAP mask with you.  Please read over the following fact sheets that you were given. [place]- Preparing For Surgery, Coughing and Deep Breathing, Pain Management

## 2016-01-31 MED ORDER — DEXTROSE 5 % IV SOLN
3.0000 g | INTRAVENOUS | Status: AC
  Administered 2016-02-01: 3 g via INTRAVENOUS
  Filled 2016-01-31: qty 3000

## 2016-02-01 ENCOUNTER — Ambulatory Visit (HOSPITAL_COMMUNITY): Payer: 59 | Admitting: Anesthesiology

## 2016-02-01 ENCOUNTER — Encounter (HOSPITAL_COMMUNITY): Payer: Self-pay | Admitting: *Deleted

## 2016-02-01 ENCOUNTER — Ambulatory Visit (HOSPITAL_COMMUNITY)
Admission: RE | Admit: 2016-02-01 | Discharge: 2016-02-01 | Disposition: A | Discharge status: Home / Self Care | Payer: 59 | Source: Ambulatory Visit | Attending: Orthopedic Surgery | Admitting: Orthopedic Surgery

## 2016-02-01 ENCOUNTER — Encounter (HOSPITAL_COMMUNITY)
Admission: RE | Disposition: A | Discharge status: Home / Self Care | Payer: Self-pay | Source: Ambulatory Visit | Attending: Orthopedic Surgery

## 2016-02-01 DIAGNOSIS — E785 Hyperlipidemia, unspecified: Secondary | ICD-10-CM | POA: Insufficient documentation

## 2016-02-01 DIAGNOSIS — F329 Major depressive disorder, single episode, unspecified: Secondary | ICD-10-CM | POA: Insufficient documentation

## 2016-02-01 DIAGNOSIS — K219 Gastro-esophageal reflux disease without esophagitis: Secondary | ICD-10-CM | POA: Diagnosis not present

## 2016-02-01 DIAGNOSIS — M1712 Unilateral primary osteoarthritis, left knee: Secondary | ICD-10-CM | POA: Insufficient documentation

## 2016-02-01 DIAGNOSIS — G4730 Sleep apnea, unspecified: Secondary | ICD-10-CM | POA: Diagnosis not present

## 2016-02-01 DIAGNOSIS — S83242A Other tear of medial meniscus, current injury, left knee, initial encounter: Secondary | ICD-10-CM | POA: Diagnosis not present

## 2016-02-01 DIAGNOSIS — F419 Anxiety disorder, unspecified: Secondary | ICD-10-CM | POA: Insufficient documentation

## 2016-02-01 DIAGNOSIS — I10 Essential (primary) hypertension: Secondary | ICD-10-CM | POA: Diagnosis not present

## 2016-02-01 DIAGNOSIS — M25462 Effusion, left knee: Secondary | ICD-10-CM | POA: Insufficient documentation

## 2016-02-01 DIAGNOSIS — M94262 Chondromalacia, left knee: Secondary | ICD-10-CM | POA: Insufficient documentation

## 2016-02-01 DIAGNOSIS — M23207 Derangement of unspecified meniscus due to old tear or injury, left knee: Secondary | ICD-10-CM | POA: Diagnosis present

## 2016-02-01 DIAGNOSIS — Z87442 Personal history of urinary calculi: Secondary | ICD-10-CM | POA: Diagnosis not present

## 2016-02-01 DIAGNOSIS — M23204 Derangement of unspecified medial meniscus due to old tear or injury, left knee: Secondary | ICD-10-CM | POA: Insufficient documentation

## 2016-02-01 HISTORY — PX: KNEE ARTHROSCOPY WITH MENISCAL REPAIR: SHX5653

## 2016-02-01 SURGERY — ARTHROSCOPY, KNEE, WITH MENISCUS REPAIR
Anesthesia: Regional | Site: Knee | Laterality: Left

## 2016-02-01 MED ORDER — FENTANYL CITRATE (PF) 100 MCG/2ML IJ SOLN
25.0000 ug | INTRAMUSCULAR | Status: DC | PRN
  Administered 2016-02-01 (×3): 50 ug via INTRAVENOUS

## 2016-02-01 MED ORDER — FENTANYL CITRATE (PF) 100 MCG/2ML IJ SOLN
INTRAMUSCULAR | Status: AC
  Filled 2016-02-01: qty 2

## 2016-02-01 MED ORDER — CHLORHEXIDINE GLUCONATE 4 % EX LIQD: 60.0000 mL | Freq: Once | CUTANEOUS | Status: DC

## 2016-02-01 MED ORDER — LACTATED RINGERS IV SOLN
INTRAVENOUS | Status: DC
  Administered 2016-02-01 (×2): via INTRAVENOUS

## 2016-02-01 MED ORDER — MIDAZOLAM HCL 2 MG/2ML IJ SOLN
1.0000 mg | Freq: Once | INTRAMUSCULAR | Status: AC
  Administered 2016-02-01: 2 mg via INTRAVENOUS

## 2016-02-01 MED ORDER — ROCURONIUM BROMIDE 100 MG/10ML IV SOLN
INTRAVENOUS | Status: DC | PRN
  Administered 2016-02-01: 50 mg via INTRAVENOUS
  Administered 2016-02-01: 20 mg via INTRAVENOUS
  Administered 2016-02-01 (×3): 10 mg via INTRAVENOUS

## 2016-02-01 MED ORDER — OXYCODONE HCL 5 MG PO TABS
ORAL_TABLET | ORAL | Status: AC
  Filled 2016-02-01: qty 1

## 2016-02-01 MED ORDER — ONDANSETRON HCL 4 MG/2ML IJ SOLN
INTRAMUSCULAR | Status: DC | PRN
  Administered 2016-02-01: 4 mg via INTRAVENOUS

## 2016-02-01 MED ORDER — OXYCODONE HCL 5 MG PO TABS
5.0000 mg | ORAL_TABLET | Freq: Once | ORAL | Status: AC | PRN
  Administered 2016-02-01: 5 mg via ORAL

## 2016-02-01 MED ORDER — FENTANYL CITRATE (PF) 100 MCG/2ML IJ SOLN
50.0000 ug | Freq: Once | INTRAMUSCULAR | Status: AC
  Administered 2016-02-01: 100 ug via INTRAVENOUS

## 2016-02-01 MED ORDER — MORPHINE SULFATE (PF) 4 MG/ML IV SOLN
INTRAVENOUS | Status: AC
Start: 2016-02-01 — End: 2016-02-01
  Filled 2016-02-01: qty 1

## 2016-02-01 MED ORDER — ONDANSETRON HCL 4 MG/2ML IJ SOLN: 4.0000 mg | Freq: Once | INTRAMUSCULAR | Status: DC | PRN

## 2016-02-01 MED ORDER — MIDAZOLAM HCL 2 MG/2ML IJ SOLN
INTRAMUSCULAR | Status: AC
  Filled 2016-02-01: qty 2

## 2016-02-01 MED ORDER — SUGAMMADEX SODIUM 500 MG/5ML IV SOLN
INTRAVENOUS | Status: DC | PRN
  Administered 2016-02-01: 299.6 mg via INTRAVENOUS

## 2016-02-01 MED ORDER — OXYCODONE HCL 5 MG PO TABS: 5.0000 mg | ORAL_TABLET | Freq: Once | ORAL | 0 refills | Status: DC | PRN

## 2016-02-01 MED ORDER — SUGAMMADEX SODIUM 500 MG/5ML IV SOLN
INTRAVENOUS | Status: AC
  Filled 2016-02-01: qty 5

## 2016-02-01 MED ORDER — SODIUM CHLORIDE 0.9 % IR SOLN
Status: DC | PRN
  Administered 2016-02-01 (×2): 3000 mL
  Administered 2016-02-01: 6000 mL

## 2016-02-01 MED ORDER — MIDAZOLAM HCL 2 MG/2ML IJ SOLN
INTRAMUSCULAR | Status: AC
  Administered 2016-02-01: 2 mg via INTRAVENOUS
  Filled 2016-02-01: qty 2

## 2016-02-01 MED ORDER — OXYCODONE HCL 5 MG/5ML PO SOLN: 5.0000 mg | Freq: Once | ORAL | Status: DC | PRN

## 2016-02-01 MED ORDER — PROPOFOL 10 MG/ML IV BOLUS
INTRAVENOUS | Status: AC
  Filled 2016-02-01: qty 20

## 2016-02-01 MED ORDER — OXYCODONE HCL 5 MG/5ML PO SOLN: 5.0000 mg | Freq: Once | ORAL | Status: AC | PRN

## 2016-02-01 MED ORDER — BUPIVACAINE-EPINEPHRINE (PF) 0.5% -1:200000 IJ SOLN
INTRAMUSCULAR | Status: DC | PRN
  Administered 2016-02-01: 10 mL via PERINEURAL

## 2016-02-01 MED ORDER — FENTANYL CITRATE (PF) 100 MCG/2ML IJ SOLN
INTRAMUSCULAR | Status: AC
  Administered 2016-02-01: 100 ug via INTRAVENOUS
  Filled 2016-02-01: qty 2

## 2016-02-01 MED ORDER — MORPHINE SULFATE (PF) 4 MG/ML IV SOLN
INTRAVENOUS | Status: AC
  Filled 2016-02-01: qty 1

## 2016-02-01 MED ORDER — STERILE WATER FOR IRRIGATION IR SOLN
Status: DC | PRN
  Administered 2016-02-01: 1000 mL

## 2016-02-01 MED ORDER — FENTANYL CITRATE (PF) 100 MCG/2ML IJ SOLN
INTRAMUSCULAR | Status: AC
  Administered 2016-02-01: 50 ug via INTRAVENOUS
  Filled 2016-02-01: qty 2

## 2016-02-01 MED ORDER — ONDANSETRON HCL 4 MG/2ML IJ SOLN
INTRAMUSCULAR | Status: AC
  Filled 2016-02-01: qty 2

## 2016-02-01 MED ORDER — CLONIDINE HCL (ANALGESIA) 100 MCG/ML EP SOLN
EPIDURAL | Status: DC | PRN
  Administered 2016-02-01: 1 mL

## 2016-02-01 MED ORDER — CLONIDINE HCL (ANALGESIA) 100 MCG/ML EP SOLN
150.0000 ug | Freq: Once | EPIDURAL | Status: DC
  Filled 2016-02-01: qty 1.5

## 2016-02-01 MED ORDER — EPHEDRINE SULFATE 50 MG/ML IJ SOLN
INTRAMUSCULAR | Status: DC | PRN
  Administered 2016-02-01: 5 mg via INTRAVENOUS
  Administered 2016-02-01 (×2): 10 mg via INTRAVENOUS
  Administered 2016-02-01: 5 mg via INTRAVENOUS

## 2016-02-01 MED ORDER — BUPIVACAINE HCL (PF) 0.25 % IJ SOLN
INTRAMUSCULAR | Status: DC | PRN
  Administered 2016-02-01: 20 mL

## 2016-02-01 MED ORDER — LIDOCAINE 2% (20 MG/ML) 5 ML SYRINGE
INTRAMUSCULAR | Status: AC
  Filled 2016-02-01: qty 5

## 2016-02-01 MED ORDER — PHENYLEPHRINE 40 MCG/ML (10ML) SYRINGE FOR IV PUSH (FOR BLOOD PRESSURE SUPPORT)
PREFILLED_SYRINGE | INTRAVENOUS | Status: DC | PRN
  Administered 2016-02-01: 40 ug via INTRAVENOUS

## 2016-02-01 MED ORDER — FENTANYL CITRATE (PF) 100 MCG/2ML IJ SOLN: 25.0000 ug | INTRAMUSCULAR | Status: DC | PRN

## 2016-02-01 MED ORDER — MIDAZOLAM HCL 5 MG/5ML IJ SOLN
INTRAMUSCULAR | Status: DC | PRN
  Administered 2016-02-01: 2 mg via INTRAVENOUS

## 2016-02-01 MED ORDER — DEXAMETHASONE SODIUM PHOSPHATE 4 MG/ML IJ SOLN
INTRAMUSCULAR | Status: DC | PRN
  Administered 2016-02-01: 10 mg via INTRAVENOUS

## 2016-02-01 MED ORDER — ASPIRIN EC 325 MG PO TBEC: 325.0000 mg | DELAYED_RELEASE_TABLET | Freq: Every day | ORAL | 0 refills | Status: DC

## 2016-02-01 MED ORDER — SODIUM CHLORIDE 0.9 % IR SOLN
Status: DC | PRN
  Administered 2016-02-01 (×6)

## 2016-02-01 MED ORDER — 0.9 % SODIUM CHLORIDE (POUR BTL) OPTIME
TOPICAL | Status: DC | PRN
  Administered 2016-02-01: 1000 mL

## 2016-02-01 MED ORDER — PROPOFOL 10 MG/ML IV BOLUS
INTRAVENOUS | Status: DC | PRN
  Administered 2016-02-01: 200 mg via INTRAVENOUS

## 2016-02-01 MED ORDER — MORPHINE SULFATE (PF) 4 MG/ML IV SOLN
INTRAVENOUS | Status: DC | PRN
  Administered 2016-02-01 (×2): 4 mg

## 2016-02-01 MED ORDER — OXYCODONE HCL 5 MG PO TABS: 5.0000 mg | ORAL_TABLET | Freq: Once | ORAL | Status: DC | PRN

## 2016-02-01 MED ORDER — EPINEPHRINE PF 1 MG/ML IJ SOLN
INTRAMUSCULAR | Status: AC
  Filled 2016-02-01: qty 1

## 2016-02-01 MED ORDER — FENTANYL CITRATE (PF) 100 MCG/2ML IJ SOLN
INTRAMUSCULAR | Status: DC | PRN
  Administered 2016-02-01 (×2): 25 ug via INTRAVENOUS
  Administered 2016-02-01: 50 ug via INTRAVENOUS

## 2016-02-01 MED ORDER — BUPIVACAINE HCL (PF) 0.5 % IJ SOLN
INTRAMUSCULAR | Status: AC
  Filled 2016-02-01: qty 30

## 2016-02-01 MED ORDER — DEXAMETHASONE SODIUM PHOSPHATE 10 MG/ML IJ SOLN
INTRAMUSCULAR | Status: AC
  Filled 2016-02-01: qty 1

## 2016-02-01 MED ORDER — LIDOCAINE HCL (CARDIAC) 20 MG/ML IV SOLN
INTRAVENOUS | Status: DC | PRN
  Administered 2016-02-01: 60 mg via INTRAVENOUS

## 2016-02-01 MED ORDER — EPINEPHRINE PF 1 MG/ML IJ SOLN
INTRAMUSCULAR | Status: AC
  Filled 2016-02-01: qty 3

## 2016-02-01 MED ORDER — BUPIVACAINE HCL (PF) 0.25 % IJ SOLN
INTRAMUSCULAR | Status: AC
  Filled 2016-02-01: qty 30

## 2016-02-01 SURGICAL SUPPLY — 79 items
ALCOHOL 70% 16 OZ (MISCELLANEOUS) ×2 IMPLANT
ANCHOR PUSHLOCK PEEK 3.5X19.5 (Anchor) ×2 IMPLANT
BANDAGE ACE 6X5 VEL STRL LF (GAUZE/BANDAGES/DRESSINGS) IMPLANT
BANDAGE ELASTIC 6 VELCRO ST LF (GAUZE/BANDAGES/DRESSINGS) ×2 IMPLANT
BANDAGE ESMARK 6X9 LF (GAUZE/BANDAGES/DRESSINGS) IMPLANT
BLADE CUTTER GATOR 3.5 (BLADE) ×2 IMPLANT
BLADE GREAT WHITE 4.2 (BLADE) ×2 IMPLANT
BLADE SURG 10 STRL SS (BLADE) ×2 IMPLANT
BLADE SURG 15 STRL LF DISP TIS (BLADE) ×1 IMPLANT
BLADE SURG 15 STRL SS (BLADE) ×1
BLADE SURG ROTATE 9660 (MISCELLANEOUS) IMPLANT
BNDG ESMARK 6X9 LF (GAUZE/BANDAGES/DRESSINGS)
BUR OVAL 6.0 (BURR) ×2 IMPLANT
CANNULA 5.75X71 LONG (CANNULA) ×2 IMPLANT
COVER MAYO STAND STRL (DRAPES) ×2 IMPLANT
COVER SURGICAL LIGHT HANDLE (MISCELLANEOUS) ×2 IMPLANT
CUFF TOURNIQUET SINGLE 34IN LL (TOURNIQUET CUFF) IMPLANT
CUFF TOURNIQUET SINGLE 44IN (TOURNIQUET CUFF) IMPLANT
DRAPE ARTHROSCOPY W/POUCH 114 (DRAPES) ×2 IMPLANT
DRAPE INCISE IOBAN 66X45 STRL (DRAPES) IMPLANT
DRAPE PROXIMA HALF (DRAPES) IMPLANT
DRAPE U-SHAPE 47X51 STRL (DRAPES) ×2 IMPLANT
DRILL FLIPCUTTER II 7.0MM (INSTRUMENTS) ×1 IMPLANT
DRSG TEGADERM 4X4.75 (GAUZE/BANDAGES/DRESSINGS) ×2 IMPLANT
DURAPREP 26ML APPLICATOR (WOUND CARE) ×2 IMPLANT
ELECT REM PT RETURN 9FT ADLT (ELECTROSURGICAL) ×2
ELECTRODE REM PT RTRN 9FT ADLT (ELECTROSURGICAL) ×1 IMPLANT
FIBERSTICK 2 (SUTURE) ×4 IMPLANT
FLIP CUTTER II 7.0MM (INSTRUMENTS) ×2
FLUID NSS /IRRIG 3000 ML XXX (IV SOLUTION) ×12 IMPLANT
GAUZE SPONGE 4X4 12PLY STRL (GAUZE/BANDAGES/DRESSINGS) IMPLANT
GAUZE XEROFORM 1X8 LF (GAUZE/BANDAGES/DRESSINGS) ×2 IMPLANT
GLOVE BIO SURGEON STRL SZ7 (GLOVE) ×2 IMPLANT
GLOVE BIOGEL PI IND STRL 7.5 (GLOVE) ×1 IMPLANT
GLOVE BIOGEL PI IND STRL 8 (GLOVE) ×1 IMPLANT
GLOVE BIOGEL PI INDICATOR 7.5 (GLOVE) ×1
GLOVE BIOGEL PI INDICATOR 8 (GLOVE) ×1
GLOVE ECLIPSE 7.0 STRL STRAW (GLOVE) ×2 IMPLANT
GLOVE SURG ORTHO 8.0 STRL STRW (GLOVE) ×2 IMPLANT
GOWN STRL REUS W/ TWL LRG LVL3 (GOWN DISPOSABLE) ×2 IMPLANT
GOWN STRL REUS W/ TWL XL LVL3 (GOWN DISPOSABLE) ×1 IMPLANT
GOWN STRL REUS W/TWL LRG LVL3 (GOWN DISPOSABLE) ×2
GOWN STRL REUS W/TWL XL LVL3 (GOWN DISPOSABLE) ×1
IMMOBILIZER KNEE 22 UNIV (SOFTGOODS) ×2 IMPLANT
KIT BASIN OR (CUSTOM PROCEDURE TRAY) ×2 IMPLANT
KIT ROOM TURNOVER OR (KITS) ×2 IMPLANT
MANIFOLD NEPTUNE II (INSTRUMENTS) IMPLANT
NEEDLE 18GX1X1/2 (RX/OR ONLY) (NEEDLE) ×4 IMPLANT
NEEDLE FILTER BLUNT 18X 1/2SAF (NEEDLE) ×2
NEEDLE FILTER BLUNT 18X1 1/2 (NEEDLE) ×2 IMPLANT
NEEDLE HYPO 25GX1X1/2 BEV (NEEDLE) ×2 IMPLANT
NEEDLE SUT 2-0 SCORPION KNEE (NEEDLE) ×2 IMPLANT
NS IRRIG 1000ML POUR BTL (IV SOLUTION) ×2 IMPLANT
PACK ARTHROSCOPY DSU (CUSTOM PROCEDURE TRAY) ×2 IMPLANT
PAD ARMBOARD 7.5X6 YLW CONV (MISCELLANEOUS) ×4 IMPLANT
PADDING CAST COTTON 6X4 STRL (CAST SUPPLIES) ×2 IMPLANT
PENCIL BUTTON HOLSTER BLD 10FT (ELECTRODE) ×2 IMPLANT
PROBE BIPOLAR ATHRO 135MM 90D (MISCELLANEOUS) ×2 IMPLANT
SET ARTHROSCOPY TUBING (MISCELLANEOUS) ×1
SET ARTHROSCOPY TUBING LN (MISCELLANEOUS) ×1 IMPLANT
SOL PREP POV-IOD 4OZ 10% (MISCELLANEOUS) ×2 IMPLANT
SPONGE GAUZE 4X4 12PLY STER LF (GAUZE/BANDAGES/DRESSINGS) ×2 IMPLANT
SPONGE LAP 4X18 X RAY DECT (DISPOSABLE) ×2 IMPLANT
SUT ETHILON 3 0 PS 1 (SUTURE) IMPLANT
SUT FIBERWIRE #2 26 STR GREEN (SUTURE) ×6
SUT FIBERWIRE #2 38 T-5 BLUE (SUTURE) ×6
SUT MENISCAL KIT (KITS) IMPLANT
SUT VIC AB 2-0 CT1 27 (SUTURE) ×1
SUT VIC AB 2-0 CT1 TAPERPNT 27 (SUTURE) ×1 IMPLANT
SUTURE FIBERWR #2 26 STR GREEN (SUTURE) ×3 IMPLANT
SUTURE FIBERWR #2 38 T-5 BLUE (SUTURE) ×3 IMPLANT
SYR 20ML ECCENTRIC (SYRINGE) ×2 IMPLANT
SYR CONTROL 10ML LL (SYRINGE) IMPLANT
SYR TB 1ML LUER SLIP (SYRINGE) ×4 IMPLANT
TOWEL OR 17X24 6PK STRL BLUE (TOWEL DISPOSABLE) ×2 IMPLANT
TOWEL OR 17X26 10 PK STRL BLUE (TOWEL DISPOSABLE) ×2 IMPLANT
TUBE CONNECTING 12X1/4 (SUCTIONS) ×2 IMPLANT
WAND HAND CNTRL MULTIVAC 90 (MISCELLANEOUS) IMPLANT
WATER STERILE IRR 1000ML POUR (IV SOLUTION) ×2 IMPLANT

## 2016-02-01 NOTE — Transfer of Care (Signed)
Immediate Anesthesia Transfer of Care Note  Patient: Adam Stephenson  Procedure(s) Performed: Procedure(s): KNEE ARTHROSCOPY WITH MENISCAL ROOT REPAIR VS DEBRIDEMENT (Left)  Patient Location: PACU  Anesthesia Type:GA combined with regional for post-op pain  Level of Consciousness: awake, alert , oriented and patient cooperative  Airway & Oxygen Therapy: Patient Spontanous Breathing and Patient connected to nasal cannula oxygen  Post-op Assessment: Report given to RN, Post -op Vital signs reviewed and stable and Patient moving all extremities X 4  Post vital signs: Reviewed and stable  Last Vitals:  Vitals:   02/01/16 0910  BP: (!) 146/96  Pulse: 67  Resp: 20  Temp: 36.8 C    Last Pain:  Vitals:   02/01/16 0940  TempSrc:   PainSc: 0-No pain         Complications: No apparent anesthesia complications

## 2016-02-01 NOTE — H&P (Signed)
Adam Stephenson is an 50 y.o. male.   Chief Complaint: Left knee pain HPI: Adam Stephenson is a 12 year old patient with several month history of left knee pain.  He's had this pain after feeling a pop in his knee.  Developed swelling and pain.  MRI scan shows meniscal root tear with early but mild chondromalacia on the medial femoral condyle.  He's had failure of nonoperative management including multiple aspirations and injections along with nonweightbearing exercises and bracing.  There is no family history of pulmonary embolism or deep vein thrombosis  Past Medical History:  Diagnosis Date  . Anxiety   . Complication of anesthesia     2007- Colonoscopy had to be stopped due not breathing, 2016- at [facility]- BP pressure bottomed  . Depression   . Dysrhythmia   . GERD (gastroesophageal reflux disease)   . Headache   . History of kidney stones    x2  passed  . Hyperlipidemia   . Hypertension   . Sleep apnea    USES CPAP  . TBI (traumatic brain injury) ([place]) 2002  . Tinea versicolor    arm    Past Surgical History:  Procedure Laterality Date  . ADENOIDECTOMY    . CARPAL TUNNEL RELEASE Right 07/14/2015   Procedure: CARPAL TUNNEL RELEASE;  Surgeon: Meredith Pel, MD;  Location: [facility];  Service: Orthopedics;  Laterality: Right;  . carpel tunnel left Left   . CHOLECYSTECTOMY  1994  . CLAVICLE HARDWARE REMOVAL Left 03/2009  . CLAVICLE SURGERY Left 07/2008  . COLONOSCOPY     polyp  . ESOPHAGOGASTRODUODENOSCOPY ENDOSCOPY    . FINGER GANGLION CYST EXCISION Right   . HAND SURGERY Right    ring finger - cyst  . HEMORRHOID SURGERY  2016  . MOUTH SURGERY     wisdom teeth removed  . ORIF CLAVICLE FRACTURE Right 07/14/2015  . ORIF CLAVICULAR FRACTURE Right 07/14/2015   Procedure: OPEN REDUCTION INTERNAL FIXATION (ORIF) RIGHT CLAVICLE FRACTURE, RIGHT CARPAL TUNNEL RELEASE;  Surgeon: Meredith Pel, MD;  Location: [facility];  Service: Orthopedics;  Laterality: Right;  . SIGMOIDOSCOPY    .  TONSILLECTOMY      History reviewed. No pertinent family history. Social History:  reports that he has never smoked. He has never used smokeless tobacco. He reports that he drinks about 4.2 oz of alcohol per week . He reports that he does not use drugs.  Allergies: No Known Allergies  Medications Prior to Admission  Medication Sig Dispense Refill  . atorvastatin (LIPITOR) 40 MG tablet Take 40 mg by mouth every morning.     . clotrimazole-betamethasone (LOTRISONE) cream Apply 1 application topically 2 (two) times daily. 30 g 0  . hydrochlorothiazide (HYDRODIURIL) 25 MG tablet Take 25 mg by mouth every morning.     . irbesartan (AVAPRO) 300 MG tablet Take 300 mg by mouth every morning.     . Liniments (SALONPAS EX) Apply 1 application topically daily as needed (knee pain).    . NON FORMULARY Take 1 Units by mouth at bedtime. CPAP    . NONFORMULARY OR COMPOUNDED ITEM [facility]:  Onychomycosis Nail Lacquer - Fluconazole 2%, Terbinafine 1%, DMSO. Apply to affected area daily. 120 each 2  . pantoprazole (PROTONIX) 40 MG tablet Take 40 mg by mouth every morning.     Marland Kitchen KERYDIN 5 % SOLN APPLY 1 DROP TOPICALLY 2 (TWO) TIMES DAILY. (Patient not taking: Reported on 01/22/2016) 1 Bottle 11  . methocarbamol (ROBAXIN) 500 MG tablet Take  1 tablet (500 mg total) by mouth every 6 (six) hours as needed for muscle spasms. (Patient not taking: Reported on 01/22/2016) 30 tablet 0  . Multiple Vitamin (MULTI-VITAMINS) TABS Take 1 tablet by mouth daily.    Marland Kitchen oxyCODONE (ROXICODONE) 5 MG immediate release tablet Take 1-2 tablets (5-10 mg total) by mouth 2 (two) times daily as needed for severe pain. (Patient not taking: Reported on 01/22/2016) 10 tablet 0    No results found for this or any previous visit (from the past 48 hour(s)). No results found.  Review of Systems  Musculoskeletal: Positive for joint pain.  All other systems reviewed and are negative.   Blood pressure (!) 146/96, pulse 67, temperature  98.2 F (36.8 C), temperature source Oral, resp. rate 20, height 6' (1.829 m), weight (!) 330 lb 3.2 oz (149.8 kg), SpO2 98 %. Physical Exam  Constitutional: He appears well-developed.  HENT:  Head: Normocephalic.  Eyes: Pupils are equal, round, and reactive to light.  Neck: Normal range of motion.  Cardiovascular: Normal rate.   Respiratory: Effort normal.  Neurological: He is alert.  Skin: Skin is warm.  Psychiatric: He has a normal mood and affect.   Examination of the left knee demonstrates stable collateral crucial ligaments full range of motion palpable pedal pulse moderate effusion is present medial joint line tenderness is present no groin pain with internal/external rotation of the leg  Assessment/Plan Impression is left knee medial meniscal tear with meniscal root tear by MRI scan plan arthroscopy evaluation of the meniscal tear with an attempt at meniscal root tear repair if possible.  Risks and benefits discussed with the patient including not limited to infection or vessel damage potential for not being able to repair the meniscal root as well as incomplete pain relief all questions answered  Anderson Malta, MD 02/01/2016, 12:01 PM

## 2016-02-01 NOTE — Brief Op Note (Signed)
02/01/2016  3:22 PM  PATIENT:  Adam Stephenson  50 y.o. male  PRE-OPERATIVE DIAGNOSIS:  LEFT KNEE MENISCAL ROOT AVULSION  POST-OPERATIVE DIAGNOSIS:  LEFT KNEE MENISCAL ROOT AVULSION  PROCEDURE:  Procedure(s): KNEE ARTHROSCOPY WITH MENISCAL ROOT REPAIR VS DEBRIDEMENT  SURGEON:  Surgeon(s): Meredith Pel, MD  ASSISTANT: Glendell Docker RNFA NESTHESIA:   general  EBL: 10 ml    Total I/O In: 1400 [I.V.:1350; IV Piggyback:50] Out: 25 [Blood:25]  BLOOD ADMINISTERED: none  DRAINS: none   LOCAL MEDICATIONS USED:  Marcaine morphine clonidine  SPECIMEN:  No Specimen  COUNTS:  YES  TOURNIQUET:    DICTATION: .Other Dictation: Dictation Number [id number]  PLAN OF CARE: Discharge to home after PACU  PATIENT DISPOSITION:  PACU - hemodynamically stable

## 2016-02-01 NOTE — Anesthesia Procedure Notes (Addendum)
Anesthesia Regional Block:  Adductor canal block  Pre-Anesthetic Checklist: ,, timeout performed, Correct Patient, Correct Site, Correct Laterality, Correct Procedure, Correct Position, site marked, Risks and benefits discussed,  Surgical consent,  Pre-op evaluation,  At surgeon's request and post-op pain management  Laterality: Left  Prep: chloraprep       Needles:  Injection technique: Single-shot  Needle Type: Stimulator Needle - 80      Needle Gauge: 21 and 21 G    Additional Needles:  Procedures: ultrasound guided (picture in chart) Adductor canal block Narrative:  Start time: 02/01/2016 10:30 AM End time: 02/01/2016 10:35 AM Injection made incrementally with aspirations every 5 mL.  Performed by: Personally  Anesthesiologist: Alaa Mullally  Additional Notes: 20 cc 0.75% Naropin injected easily

## 2016-02-01 NOTE — Anesthesia Preprocedure Evaluation (Addendum)
Anesthesia Evaluation  Patient identified by MRN, date of birth, ID band Patient awake    Reviewed: Allergy & Precautions, NPO status , Patient's Chart, lab work & pertinent test results  Airway Mallampati: II  TM Distance: >3 FB Neck ROM: Full    Dental  (+) Teeth Intact, Dental Advisory Given   Pulmonary    breath sounds clear to auscultation       Cardiovascular hypertension,  Rhythm:Regular Rate:Normal     Neuro/Psych    GI/Hepatic   Endo/Other    Renal/GU      Musculoskeletal   Abdominal   Peds  Hematology   Anesthesia Other Findings   Reproductive/Obstetrics                             Anesthesia Physical Anesthesia Plan  ASA: III  Anesthesia Plan: General and Regional   Post-op Pain Management:    Induction: Intravenous  Airway Management Planned: Oral ETT  Additional Equipment:   Intra-op Plan:   Post-operative Plan: Extubation in OR  Informed Consent: I have reviewed the patients History and Physical, chart, labs and discussed the procedure including the risks, benefits and alternatives for the proposed anesthesia with the patient or authorized representative who has indicated his/her understanding and acceptance.   Dental advisory given  Plan Discussed with: CRNA and Anesthesiologist  Anesthesia Plan Comments:         Anesthesia Quick Evaluation  

## 2016-02-01 NOTE — Anesthesia Postprocedure Evaluation (Signed)
Anesthesia Post Note  Patient: Adam Stephenson  Procedure(s) Performed: Procedure(s) (LRB): KNEE ARTHROSCOPY WITH MENISCAL ROOT REPAIR VS DEBRIDEMENT (Left)  Anesthesia Post Evaluation  Last Vitals:  Vitals:   02/01/16 0910  BP: (!) 146/96  Pulse: 67  Resp: 20  Temp: 36.8 C    Last Pain:  Vitals:   02/01/16 0940  TempSrc:   PainSc: 0-No pain                 Meighan Treto COKER

## 2016-02-01 NOTE — Anesthesia Postprocedure Evaluation (Signed)
Anesthesia Post Note  Patient: Adam Stephenson  Procedure(s) Performed: Procedure(s) (LRB): KNEE ARTHROSCOPY WITH MENISCAL ROOT REPAIR VS DEBRIDEMENT (Left)  Patient location during evaluation: PACU Anesthesia Type: General Level of consciousness: awake, awake and alert and oriented Pain management: pain level controlled Vital Signs Assessment: post-procedure vital signs reviewed and stable Respiratory status: spontaneous breathing, nonlabored ventilation and respiratory function stable Cardiovascular status: blood pressure returned to baseline Anesthetic complications: no    Last Vitals:  Vitals:   02/01/16 0910  BP: (!) 146/96  Pulse: 67  Resp: 20  Temp: 36.8 C    Last Pain:  Vitals:   02/01/16 0940  TempSrc:   PainSc: 0-No pain                 Nela Bascom COKER

## 2016-02-01 NOTE — Progress Notes (Signed)
Orthopedic Tech Progress Note Patient Details:  Adam Stephenson [birth year] [id number]  Ortho Devices Type of Ortho Device: Crutches Ortho Device/Splint Interventions: Application   [name] 02/01/2016, 4:36 PM

## 2016-02-02 ENCOUNTER — Encounter (HOSPITAL_COMMUNITY): Payer: Self-pay | Admitting: Orthopedic Surgery

## 2016-02-02 NOTE — Op Note (Signed)
NAME:  Adam Stephenson, Adam Stephenson NO.:  [id number]  MEDICAL RECORD NO.:  [id number]  LOCATION:  [facility]                         FACILITY:  [facility]  PHYSICIAN:  Anderson Malta, M.D.    DATE OF BIRTH:  1966-02-11  DATE OF PROCEDURE: DATE OF DISCHARGE:  02/01/2016                              OPERATIVE REPORT   PREOPERATIVE DIAGNOSIS:  Left knee meniscal root avulsion.  POSTOPERATIVE DIAGNOSIS:  Left knee meniscal root avulsion.  PROCEDURES:  Left knee arthroscopy.  Chondroplasty of medial femoral condyle.  Medial tibial plateau with meniscal root repair.  SURGEON:  Anderson Malta, M.D.  ASSISTANT:  Laure Kidney, RNFA  INDICATIONS:  Amaurion Feith is a 49 year old patient, left knee arthritis and effusion with meniscal root tear, presents for operative management after explanation of risks and benefits.  PROCEDURE IN DETAIL:  The patient was brought to the operating room where general anesthetic was induced.  Preoperative antibiotics were administered.  Time-out was called.  Left leg was prescrubbed with alcohol and Betadine, allowed to air dry, prepped with DuraPrep solution and draped in a sterile manner.  The patient had full active and passive range of motion of the knee, stable collaterals at 0 and 30 degrees, cruciates intact, no posterolateral rotatory instabilities noted. Anterior inferolateral portals were established.  Anterior-inferomedial portals were established under direct visualization.  Diagnostic arthroscopy demonstrated grade 1 changes in the patellofemoral compartment.  No loose bodies in medial and lateral gutter.  Lateral compartment, articular cartilage and meniscus were intact.  Medial compartment demonstrated meniscal root tear with intact ligament of Wrisberg.  The patient had grade 2-3 changes on the medial femoral condyle about 50% as well as about 40% grade 2 changes on the medial tibial plateau.  The chondroplasty was performed.  The 3-0  cinch sutures, FiberWire from [employer] were placed.  Using the ACL guide, an 8- mm reamer was then used to create a socket.  Care was taken to avoid injury to the posterior neurovascular structures.  Fiber stick was passed and these sutures were then looped and brought out through the tunnel.  They were secured in about 20 degrees of flexion with a PushLock.  This gave very good route fixation.  Some fraying of the medial meniscus was trimmed.  Taken through range of motion and found to have good stability.  Good tension on the sutures was achieved.  At this time, thorough irrigation of the knee was performed.  Instruments were removed.  Portals were closed using 2-0 Vicryl, 3-0 nylon.  Solution of Marcaine, morphine, clonidine injected into the knee.  The patient tolerated the procedure well without immediate complications.  Knee immobilizer placed.     Anderson Malta, M.D.     GSD/MEDQ  D:  02/01/2016  T:  02/02/2016  Job:  [id number]

## 2016-02-08 ENCOUNTER — Encounter (INDEPENDENT_AMBULATORY_CARE_PROVIDER_SITE_OTHER): Payer: Self-pay | Admitting: Orthopedic Surgery

## 2016-02-08 ENCOUNTER — Ambulatory Visit (INDEPENDENT_AMBULATORY_CARE_PROVIDER_SITE_OTHER): Payer: 59 | Admitting: Orthopedic Surgery

## 2016-02-08 DIAGNOSIS — S83242D Other tear of medial meniscus, current injury, left knee, subsequent encounter: Secondary | ICD-10-CM

## 2016-02-08 MED ORDER — OXYCODONE HCL 5 MG PO CAPS: 5.0000 mg | ORAL_CAPSULE | ORAL | 0 refills | Status: DC | PRN

## 2016-02-08 NOTE — Progress Notes (Signed)
Post-Op Visit Note   Patient: Adam Stephenson           Date of Birth: 07/07/1965           MRN: [id number] Visit Date: 02/08/2016 PCP: Shirline Frees, MD   Assessment & Plan:  Chief Complaint:  Chief Complaint  Patient presents with  . Left Knee - Routine Post Op   Visit Diagnoses:  1. Other tear of medial meniscus, current injury, left knee, subsequent encounter     Plan: Adam Stephenson is a 50 year old patient 17.left knee arthroscopy meniscal repair doing well on exam tenderness Moderate effusion is present.  portal incisions are intact and removed today also looked at his left hand with the ultrasound is Cyst right just proximal to the A1 pulley and this does look like its near the artery and nerve in a think that's with giving him digits 34 and 5 symptoms.  Plan at this time is to keep him nonweightbearing start doing some straight leg raises, back in 2 weeks for  Follow-Up Instructions: No Follow-up on file.   Orders:  No orders of the defined types were placed in this encounter.  No orders of the defined types were placed in this encounter.    PMFS History: Patient Active Problem List   Diagnosis Date Noted  . Clavicle fracture 07/14/2015   Past Medical History:  Diagnosis Date  . Anxiety   . Complication of anesthesia     2007- Colonoscopy had to be stopped due not breathing, 2016- at [facility]- BP pressure bottomed  . Depression   . Dysrhythmia   . GERD (gastroesophageal reflux disease)   . Headache   . History of kidney stones    x2  passed  . Hyperlipidemia   . Hypertension   . Sleep apnea    USES CPAP  . TBI (traumatic brain injury) ([place]) 2002  . Tinea versicolor    arm    No family history on file.  Past Surgical History:  Procedure Laterality Date  . ADENOIDECTOMY    . CARPAL TUNNEL RELEASE Right 07/14/2015   Procedure: CARPAL TUNNEL RELEASE;  Surgeon: Meredith Pel, MD;  Location: [facility];  Service: Orthopedics;  Laterality: Right;  . carpel  tunnel left Left   . CHOLECYSTECTOMY  1994  . CLAVICLE HARDWARE REMOVAL Left 03/2009  . CLAVICLE SURGERY Left 07/2008  . COLONOSCOPY     polyp  . ESOPHAGOGASTRODUODENOSCOPY ENDOSCOPY    . FINGER GANGLION CYST EXCISION Right   . HAND SURGERY Right    ring finger - cyst  . HEMORRHOID SURGERY  2016  . KNEE ARTHROSCOPY WITH MENISCAL REPAIR Left 02/01/2016   Procedure: KNEE ARTHROSCOPY WITH MENISCAL ROOT REPAIR VS DEBRIDEMENT;  Surgeon: Meredith Pel, MD;  Location: [facility];  Service: Orthopedics;  Laterality: Left;  . MOUTH SURGERY     wisdom teeth removed  . ORIF CLAVICLE FRACTURE Right 07/14/2015  . ORIF CLAVICULAR FRACTURE Right 07/14/2015   Procedure: OPEN REDUCTION INTERNAL FIXATION (ORIF) RIGHT CLAVICLE FRACTURE, RIGHT CARPAL TUNNEL RELEASE;  Surgeon: Meredith Pel, MD;  Location: [facility];  Service: Orthopedics;  Laterality: Right;  . SIGMOIDOSCOPY    . TONSILLECTOMY     Social History   Occupational History  . Not on file.   Social History Main Topics  . Smoking status: Never Smoker  . Smokeless tobacco: Never Used  . Alcohol use 4.2 oz/week    3 Glasses of wine, 3 Cans of beer, 1 Shots of liquor  per week     Comment: occassional  . Drug use: No  . Sexual activity: Not on file

## 2016-02-18 ENCOUNTER — Encounter (INDEPENDENT_AMBULATORY_CARE_PROVIDER_SITE_OTHER): Payer: Self-pay | Admitting: Orthopedic Surgery

## 2016-02-18 ENCOUNTER — Ambulatory Visit (INDEPENDENT_AMBULATORY_CARE_PROVIDER_SITE_OTHER): Payer: 59 | Admitting: Orthopedic Surgery

## 2016-02-18 DIAGNOSIS — S83242D Other tear of medial meniscus, current injury, left knee, subsequent encounter: Secondary | ICD-10-CM

## 2016-02-19 ENCOUNTER — Encounter (HOSPITAL_COMMUNITY): Payer: Self-pay | Admitting: Orthopedic Surgery

## 2016-02-19 NOTE — Progress Notes (Signed)
Post-Op Visit Note   Patient: Adam Stephenson           Date of Birth: 03/21/1966           MRN: [id number] Visit Date: 02/18/2016 PCP: Shirline Frees, MD   Assessment & Plan:  Chief Complaint:  Chief Complaint  Patient presents with  . Left Knee - Pain, Routine Post Op   Visit Diagnoses:  1. Other tear of medial meniscus, current injury, left knee, subsequent encounter     Plan: Adam Stephenson is a 50 year old patient 17 days out from left knee meniscal repair.  Hast been having some popping in the right knee with flexion.  On exam today mild effusion is present I don't get much more than the normal postarthroscopy type popping and crepitus in the knee.  What I'm feeling for his any type of evidence that the meniscal root repair may have pulled loose.  I don't really feel that on exam.  Plan is for him to keep his appointment in 2 more weeks.  I also looked at the ultrasound on his right hand where he has potentially a cyst coming off the flexor tendon sheath.  Looks like it is a smaller cyst that he has on the right hand at the base of the fourth metacarpal compared to the left.  We can follow that along for now.  Follow-Up Instructions: No Follow-up on file.   Orders:  No orders of the defined types were placed in this encounter.  No orders of the defined types were placed in this encounter.    PMFS History: Patient Active Problem List   Diagnosis Date Noted  . Clavicle fracture 07/14/2015   Past Medical History:  Diagnosis Date  . Anxiety   . Complication of anesthesia     2007- Colonoscopy had to be stopped due not breathing, 2016- at [facility]- BP pressure bottomed  . Depression   . Dysrhythmia   . GERD (gastroesophageal reflux disease)   . Headache   . History of kidney stones    x2  passed  . Hyperlipidemia   . Hypertension   . Sleep apnea    USES CPAP  . TBI (traumatic brain injury) ([place]) 2002  . Tinea versicolor    arm    No family history on file.    Past Surgical History:  Procedure Laterality Date  . ADENOIDECTOMY    . CARPAL TUNNEL RELEASE Right 07/14/2015   Procedure: CARPAL TUNNEL RELEASE;  Surgeon: Meredith Pel, MD;  Location: [facility];  Service: Orthopedics;  Laterality: Right;  . carpel tunnel left Left   . CHOLECYSTECTOMY  1994  . CLAVICLE HARDWARE REMOVAL Left 03/2009  . CLAVICLE SURGERY Left 07/2008  . COLONOSCOPY     polyp  . ESOPHAGOGASTRODUODENOSCOPY ENDOSCOPY    . FINGER GANGLION CYST EXCISION Right   . HAND SURGERY Right    ring finger - cyst  . HEMORRHOID SURGERY  2016  . KNEE ARTHROSCOPY WITH MENISCAL REPAIR Left 02/01/2016   Procedure: KNEE ARTHROSCOPY WITH MENISCAL ROOT REPAIR VS DEBRIDEMENT;  Surgeon: Meredith Pel, MD;  Location: [facility];  Service: Orthopedics;  Laterality: Left;  . MOUTH SURGERY     wisdom teeth removed  . ORIF CLAVICLE FRACTURE Right 07/14/2015  . ORIF CLAVICULAR FRACTURE Right 07/14/2015   Procedure: OPEN REDUCTION INTERNAL FIXATION (ORIF) RIGHT CLAVICLE FRACTURE, RIGHT CARPAL TUNNEL RELEASE;  Surgeon: Meredith Pel, MD;  Location: [place] [facility] [place] [facility];  Service: Orthopedics;  Laterality: Right;  .  SIGMOIDOSCOPY    . TONSILLECTOMY     Social History   Occupational History  . Not on file.   Social History Main Topics  . Smoking status: Never Smoker  . Smokeless tobacco: Never Used  . Alcohol use 4.2 oz/week    3 Glasses of wine, 3 Cans of beer, 1 Shots of liquor per week     Comment: occassional  . Drug use: No  . Sexual activity: Not on file

## 2016-02-22 ENCOUNTER — Ambulatory Visit (INDEPENDENT_AMBULATORY_CARE_PROVIDER_SITE_OTHER): Payer: 59 | Admitting: Orthopedic Surgery

## 2016-03-02 ENCOUNTER — Ambulatory Visit (INDEPENDENT_AMBULATORY_CARE_PROVIDER_SITE_OTHER): Payer: 59 | Admitting: Orthopedic Surgery

## 2016-03-02 ENCOUNTER — Encounter (INDEPENDENT_AMBULATORY_CARE_PROVIDER_SITE_OTHER): Payer: Self-pay | Admitting: Orthopedic Surgery

## 2016-03-02 DIAGNOSIS — S83242D Other tear of medial meniscus, current injury, left knee, subsequent encounter: Secondary | ICD-10-CM

## 2016-03-04 NOTE — Progress Notes (Signed)
Post-Op Visit Note   Patient: Adam Stephenson           Date of Birth: November 14, 1965           MRN: [id number] Visit Date: 03/02/2016 PCP: Shirline Frees, MD   Assessment & Plan:  Chief Complaint:  Chief Complaint  Patient presents with  . Left Knee - Routine Post Op   Visit Diagnoses:  1. Other tear of medial meniscus, current injury, left knee, subsequent encounter     Plan: Adam Stephenson is a 50 year old 4 weeks out left knee meniscal root repair.  Generally doing well.  His been nonweightbearing.  No calf tenderness today.  Knee effusion is present and that is aspirated and we got about 6 40 mL out.  He can bend it to 90.  Plan at this point in time is to let him do touchdown weightbearing for a week partial weightbearing for a week and then weightbearing as tolerated to follow.  I'll see him back in about 4 weeks for clinical recheck.  No loading with the knee greater than 90 of flexion.  Follow-Up Instructions: Return in about 4 weeks (around 03/30/2016).   Orders:  No orders of the defined types were placed in this encounter.  No orders of the defined types were placed in this encounter.    PMFS History: Patient Active Problem List   Diagnosis Date Noted  . Other tear of medial meniscus, current injury, left knee, subsequent encounter 03/02/2016  . Clavicle fracture 07/14/2015   Past Medical History:  Diagnosis Date  . Anxiety   . Complication of anesthesia     2007- Colonoscopy had to be stopped due not breathing, 2016- at [facility]- BP pressure bottomed  . Depression   . Dysrhythmia   . GERD (gastroesophageal reflux disease)   . Headache   . History of kidney stones    x2  passed  . Hyperlipidemia   . Hypertension   . Sleep apnea    USES CPAP  . TBI (traumatic brain injury) ([place]) 2002  . Tinea versicolor    arm    No family history on file.  Past Surgical History:  Procedure Laterality Date  . ADENOIDECTOMY    . CARPAL TUNNEL RELEASE Right 07/14/2015   Procedure: CARPAL TUNNEL RELEASE;  Surgeon: Meredith Pel, MD;  Location: [facility];  Service: Orthopedics;  Laterality: Right;  . carpel tunnel left Left   . CHOLECYSTECTOMY  1994  . CLAVICLE HARDWARE REMOVAL Left 03/2009  . CLAVICLE SURGERY Left 07/2008  . COLONOSCOPY     polyp  . ESOPHAGOGASTRODUODENOSCOPY ENDOSCOPY    . FINGER GANGLION CYST EXCISION Right   . HAND SURGERY Right    ring finger - cyst  . HEMORRHOID SURGERY  2016  . KNEE ARTHROSCOPY WITH MENISCAL REPAIR Left 02/01/2016   Procedure: KNEE ARTHROSCOPY WITH MENISCAL ROOT REPAIR VS DEBRIDEMENT;  Surgeon: Meredith Pel, MD;  Location: [facility];  Service: Orthopedics;  Laterality: Left;  . MOUTH SURGERY     wisdom teeth removed  . ORIF CLAVICLE FRACTURE Right 07/14/2015  . ORIF CLAVICULAR FRACTURE Right 07/14/2015   Procedure: OPEN REDUCTION INTERNAL FIXATION (ORIF) RIGHT CLAVICLE FRACTURE, RIGHT CARPAL TUNNEL RELEASE;  Surgeon: Meredith Pel, MD;  Location: [facility];  Service: Orthopedics;  Laterality: Right;  . SIGMOIDOSCOPY    . TONSILLECTOMY     Social History   Occupational History  . Not on file.   Social History Main Topics  . Smoking status: Never  Smoker  . Smokeless tobacco: Never Used  . Alcohol use 4.2 oz/week    3 Glasses of wine, 3 Cans of beer, 1 Shots of liquor per week     Comment: occassional  . Drug use: No  . Sexual activity: Not on file

## 2016-03-28 ENCOUNTER — Ambulatory Visit: Payer: 59 | Admitting: Podiatry

## 2016-03-30 ENCOUNTER — Encounter (INDEPENDENT_AMBULATORY_CARE_PROVIDER_SITE_OTHER): Payer: Self-pay | Admitting: Orthopedic Surgery

## 2016-03-30 ENCOUNTER — Ambulatory Visit (INDEPENDENT_AMBULATORY_CARE_PROVIDER_SITE_OTHER): Payer: 59 | Admitting: Orthopedic Surgery

## 2016-03-30 DIAGNOSIS — M25562 Pain in left knee: Secondary | ICD-10-CM | POA: Diagnosis not present

## 2016-03-30 DIAGNOSIS — S83242D Other tear of medial meniscus, current injury, left knee, subsequent encounter: Secondary | ICD-10-CM

## 2016-03-30 NOTE — Progress Notes (Signed)
Post-Op Visit Note   Patient: Adam Stephenson           Date of Birth: 1965-10-26           MRN: [id number] Visit Date: 03/30/2016 PCP: Shirline Frees, MD   Assessment & Plan:  Chief Complaint:  Chief Complaint  Patient presents with  . Left Knee - Routine Post Op   Visit Diagnoses:  1. Other tear of medial meniscus, current injury, left knee, subsequent encounter     Plan: Adam Stephenson is a 51 year old patient 8 weeks out left knee arthroscopy meniscal root repair in general he's doing well on exam he had an effusion flexion to 90 his pain is diminished significantly compared to preop he is walking with a cane plan at this time is to start physical therapy to do some quad strengthening exercises.  I don't want him loading the knee in knee past 90.  I don't really want and bending the knee much past 90 for at least another 4 weeks.  He'll be out of work at least another 6 weeks I'll see him back then for clinical recheck.  He's not really taking any pain medicine.  Follow-Up Instructions: Return in about 6 weeks (around 05/11/2016).   Orders:  No orders of the defined types were placed in this encounter.  No orders of the defined types were placed in this encounter.    PMFS History: Patient Active Problem List   Diagnosis Date Noted  . Other tear of medial meniscus, current injury, left knee, subsequent encounter 03/02/2016  . Clavicle fracture 07/14/2015   Past Medical History:  Diagnosis Date  . Anxiety   . Complication of anesthesia     2007- Colonoscopy had to be stopped due not breathing, 2016- at [facility]- BP pressure bottomed  . Depression   . Dysrhythmia   . GERD (gastroesophageal reflux disease)   . Headache   . History of kidney stones    x2  passed  . Hyperlipidemia   . Hypertension   . Sleep apnea    USES CPAP  . TBI (traumatic brain injury) (Adam Stephenson) 2002  . Tinea versicolor    arm    No family history on file.  Past Surgical History:  Procedure  Laterality Date  . ADENOIDECTOMY    . CARPAL TUNNEL RELEASE Right 07/14/2015   Procedure: CARPAL TUNNEL RELEASE;  Surgeon: Meredith Pel, MD;  Location: [facility];  Service: Orthopedics;  Laterality: Right;  . carpel tunnel left Left   . CHOLECYSTECTOMY  1994  . CLAVICLE HARDWARE REMOVAL Left 03/2009  . CLAVICLE SURGERY Left 07/2008  . COLONOSCOPY     polyp  . ESOPHAGOGASTRODUODENOSCOPY ENDOSCOPY    . FINGER GANGLION CYST EXCISION Right   . HAND SURGERY Right    ring finger - cyst  . HEMORRHOID SURGERY  2016  . KNEE ARTHROSCOPY WITH MENISCAL REPAIR Left 02/01/2016   Procedure: KNEE ARTHROSCOPY WITH MENISCAL ROOT REPAIR VS DEBRIDEMENT;  Surgeon: Meredith Pel, MD;  Location: [facility];  Service: Orthopedics;  Laterality: Left;  . MOUTH SURGERY     wisdom teeth removed  . ORIF CLAVICLE FRACTURE Right 07/14/2015  . ORIF CLAVICULAR FRACTURE Right 07/14/2015   Procedure: OPEN REDUCTION INTERNAL FIXATION (ORIF) RIGHT CLAVICLE FRACTURE, RIGHT CARPAL TUNNEL RELEASE;  Surgeon: Meredith Pel, MD;  Location: [facility];  Service: Orthopedics;  Laterality: Right;  . SIGMOIDOSCOPY    . TONSILLECTOMY     Social History   Occupational History  .  Not on file.   Social History Main Topics  . Smoking status: Never Smoker  . Smokeless tobacco: Never Used  . Alcohol use 4.2 oz/week    3 Glasses of wine, 3 Cans of beer, 1 Shots of liquor per week     Comment: occassional  . Drug use: No  . Sexual activity: Not on file

## 2016-03-31 DIAGNOSIS — I10 Essential (primary) hypertension: Secondary | ICD-10-CM | POA: Diagnosis not present

## 2016-03-31 DIAGNOSIS — E538 Deficiency of other specified B group vitamins: Secondary | ICD-10-CM | POA: Diagnosis not present

## 2016-03-31 DIAGNOSIS — E782 Mixed hyperlipidemia: Secondary | ICD-10-CM | POA: Diagnosis not present

## 2016-04-05 DIAGNOSIS — M25562 Pain in left knee: Secondary | ICD-10-CM | POA: Diagnosis not present

## 2016-04-07 ENCOUNTER — Ambulatory Visit: Payer: 59 | Admitting: Podiatry

## 2016-04-07 DIAGNOSIS — M25562 Pain in left knee: Secondary | ICD-10-CM | POA: Diagnosis not present

## 2016-04-11 ENCOUNTER — Encounter: Payer: Self-pay | Admitting: Podiatry

## 2016-04-11 ENCOUNTER — Ambulatory Visit (INDEPENDENT_AMBULATORY_CARE_PROVIDER_SITE_OTHER): Payer: 59 | Admitting: Podiatry

## 2016-04-11 DIAGNOSIS — M79674 Pain in right toe(s): Secondary | ICD-10-CM | POA: Diagnosis not present

## 2016-04-11 DIAGNOSIS — M79675 Pain in left toe(s): Secondary | ICD-10-CM

## 2016-04-11 DIAGNOSIS — B351 Tinea unguium: Secondary | ICD-10-CM | POA: Diagnosis not present

## 2016-04-11 DIAGNOSIS — M25562 Pain in left knee: Secondary | ICD-10-CM | POA: Diagnosis not present

## 2016-04-11 NOTE — Progress Notes (Signed)
Subjective: 51 y.o. returns the office today for painful, elongated, thickened toenails which they cannot trim themself. Denies any redness or drainage around the nails. He has been using the topical antifungal which he believes has been helping. Denies any acute changes since last appointment and no new complaints today. Denies any systemic complaints such as fevers, chills, nausea, vomiting.   Objective: AAO 3, NAD DP/PT pulses palpable, CRT less than 3 seconds Nails hypertrophic, dystrophic, elongated, brittle, discolored 2. There is tenderness overlying the hallux nails bilaterally. There is no surrounding erythema or drainage along the nail sites. No open lesions or pre-ulcerative lesions are identified. No other areas of tenderness bilateral lower extremities. No overlying edema, erythema, increased warmth. No pain with calf compression, swelling, warmth, erythema.  Assessment: Patient presents with symptomatic onychomycosis  Plan: -Treatment options including alternatives, risks, complications were discussed -Nails sharply debrided 2 without complication/bleeding. The right hallux nail is loose from the underlying nail bed.  -Continue with topical anti-fungal.  -Discussed daily foot inspection. If there are any changes, to call the office immediately.  -Follow-up in 3 months or sooner if any problems are to arise. In the meantime, encouraged to call the office with any questions, concerns, changes symptoms.  Celesta Gentile, DPM

## 2016-04-13 DIAGNOSIS — E782 Mixed hyperlipidemia: Secondary | ICD-10-CM | POA: Diagnosis not present

## 2016-04-13 DIAGNOSIS — M25562 Pain in left knee: Secondary | ICD-10-CM | POA: Diagnosis not present

## 2016-04-13 DIAGNOSIS — E538 Deficiency of other specified B group vitamins: Secondary | ICD-10-CM | POA: Diagnosis not present

## 2016-04-13 DIAGNOSIS — R7303 Prediabetes: Secondary | ICD-10-CM | POA: Diagnosis not present

## 2016-04-26 DIAGNOSIS — M25562 Pain in left knee: Secondary | ICD-10-CM | POA: Diagnosis not present

## 2016-04-26 DIAGNOSIS — E538 Deficiency of other specified B group vitamins: Secondary | ICD-10-CM | POA: Diagnosis not present

## 2016-04-26 DIAGNOSIS — I10 Essential (primary) hypertension: Secondary | ICD-10-CM | POA: Diagnosis not present

## 2016-04-26 DIAGNOSIS — E782 Mixed hyperlipidemia: Secondary | ICD-10-CM | POA: Diagnosis not present

## 2016-04-29 DIAGNOSIS — M25562 Pain in left knee: Secondary | ICD-10-CM | POA: Diagnosis not present

## 2016-05-03 DIAGNOSIS — M25562 Pain in left knee: Secondary | ICD-10-CM | POA: Diagnosis not present

## 2016-05-05 DIAGNOSIS — M25562 Pain in left knee: Secondary | ICD-10-CM | POA: Diagnosis not present

## 2016-05-09 DIAGNOSIS — M25562 Pain in left knee: Secondary | ICD-10-CM | POA: Diagnosis not present

## 2016-05-09 DIAGNOSIS — D2230 Melanocytic nevi of unspecified part of face: Secondary | ICD-10-CM | POA: Diagnosis not present

## 2016-05-09 DIAGNOSIS — Z86018 Personal history of other benign neoplasm: Secondary | ICD-10-CM | POA: Diagnosis not present

## 2016-05-09 DIAGNOSIS — D2372 Other benign neoplasm of skin of left lower limb, including hip: Secondary | ICD-10-CM | POA: Diagnosis not present

## 2016-05-11 ENCOUNTER — Ambulatory Visit (INDEPENDENT_AMBULATORY_CARE_PROVIDER_SITE_OTHER): Payer: 59 | Admitting: Orthopedic Surgery

## 2016-05-11 DIAGNOSIS — E782 Mixed hyperlipidemia: Secondary | ICD-10-CM | POA: Diagnosis not present

## 2016-05-11 DIAGNOSIS — I10 Essential (primary) hypertension: Secondary | ICD-10-CM | POA: Diagnosis not present

## 2016-05-11 DIAGNOSIS — E538 Deficiency of other specified B group vitamins: Secondary | ICD-10-CM | POA: Diagnosis not present

## 2016-05-13 DIAGNOSIS — G4733 Obstructive sleep apnea (adult) (pediatric): Secondary | ICD-10-CM | POA: Diagnosis not present

## 2016-05-13 DIAGNOSIS — M25562 Pain in left knee: Secondary | ICD-10-CM | POA: Diagnosis not present

## 2016-05-16 ENCOUNTER — Encounter (INDEPENDENT_AMBULATORY_CARE_PROVIDER_SITE_OTHER): Payer: Self-pay | Admitting: Orthopedic Surgery

## 2016-05-16 ENCOUNTER — Ambulatory Visit (INDEPENDENT_AMBULATORY_CARE_PROVIDER_SITE_OTHER): Payer: 59 | Admitting: Orthopedic Surgery

## 2016-05-16 DIAGNOSIS — S83242D Other tear of medial meniscus, current injury, left knee, subsequent encounter: Secondary | ICD-10-CM

## 2016-05-16 DIAGNOSIS — R2231 Localized swelling, mass and lump, right upper limb: Secondary | ICD-10-CM

## 2016-05-16 DIAGNOSIS — M25562 Pain in left knee: Secondary | ICD-10-CM | POA: Diagnosis not present

## 2016-05-16 NOTE — Progress Notes (Signed)
Office Visit Note   Patient: Adam Stephenson           Date of Birth: 29-Mar-1965           MRN: [id number] Visit Date: 05/16/2016 Requested by: Adam Frees, MD [address], [place] [address] PCP: Adam Frees, MD  Subjective: Chief Complaint  Patient presents with  . Left Knee - Routine Post Op    HPI Adam Stephenson is a 51 year old patient who underwent meniscal repair 02/01/2016.  In general he is walking unassisted.  He states that he doesn't came mostly.  Hurts him some and is worse at times but in general he's better than he was before surgery.  He also reports continued right hand pain with some type of mass at the base of the fourth A1 pulley.  This is getting larger.  Look at with ultrasound before but it is bigger now             Review of Systems .All systems reviewed are negative as they relate to the chief complaint within the history of present illness.  Patient denies  fevers or chills.     Assessment & Plan: Visit Diagnoses:  1. Other tear of medial meniscus, current injury, left knee, subsequent encounter   2. Mass of right hand     Plan:Impression is that the r left knee is stable.  Mild effusion but in general clinically's doing better.  The right hand is a little bit more puzzling.  This does not look like Dupuytren's contracture.  He may simply have mass swelling and tendinitis around that A1 pulley.  He is not having any triggering in the finger.  We need MRI scan of that hand to try to figure out will see him back after that study  Return for after MRI.   Orders:  Orders Placed This Encounter  Procedures  . MR Wrist Right w/ contrast   No orders of the defined types were placed in this encounter.     Procedures: No procedures performed   Clinical Data: No additional findings.  Objective: Vital Signs: There were no vitals taken for this visit.  Physical Exam   Constitutional: Patient appears well-developed HEENT:  Head:  Normocephalic Eyes:EOM are normal Neck: Normal range of motion Cardiovascular: Normal rate Pulmonary/chest: Effort normal Neurologic: Patient is alert Skin: Skin is warm Psychiatric: Patient has normal mood and affect    Ortho Exam orthopedic exam demonstrates increased body mass index collateral crucial ligaments stable in that left knee.  Mild effusion is present.  Range of motion is excellent.  Quad strength is improving.  I'll really detect any medial joint line tenderness with range of motion exercises.  Right hand is examined.  Does have more fullness at the distal palmar flexion crease.  Patient has full finger flexion and extension.  Of the fourth finger.  Radial pulses intact.  Wrist range of motion otherwise full.  Specialty Comments:  No specialty comments available.  Imaging: No results found.   PMFS History: Patient Active Problem List   Diagnosis Date Noted  . Other tear of medial meniscus, current injury, left knee, subsequent encounter 03/02/2016  . Clavicle fracture 07/14/2015   Past Medical History:  Diagnosis Date  . Anxiety   . Complication of anesthesia     2007- Colonoscopy had to be stopped due not breathing, 2016- at [facility]- BP pressure bottomed  . Depression   . Dysrhythmia   . GERD (gastroesophageal reflux disease)   .  Headache   . History of kidney stones    x2  passed  . Hyperlipidemia   . Hypertension   . Sleep apnea    USES CPAP  . TBI (traumatic brain injury) ([facility]) 2002  . Tinea versicolor    arm    No family history on file.  Past Surgical History:  Procedure Laterality Date  . ADENOIDECTOMY    . CARPAL TUNNEL RELEASE Right 07/14/2015   Procedure: CARPAL TUNNEL RELEASE;  Surgeon: Meredith Pel, MD;  Location: [facility];  Service: Orthopedics;  Laterality: Right;  . carpel tunnel left Left   . CHOLECYSTECTOMY  1994  . CLAVICLE HARDWARE REMOVAL Left 03/2009  . CLAVICLE SURGERY Left 07/2008  . COLONOSCOPY     polyp  .  ESOPHAGOGASTRODUODENOSCOPY ENDOSCOPY    . FINGER GANGLION CYST EXCISION Right   . HAND SURGERY Right    ring finger - cyst  . HEMORRHOID SURGERY  2016  . KNEE ARTHROSCOPY WITH MENISCAL REPAIR Left 02/01/2016   Procedure: KNEE ARTHROSCOPY WITH MENISCAL ROOT REPAIR VS DEBRIDEMENT;  Surgeon: Meredith Pel, MD;  Location: [facility];  Service: Orthopedics;  Laterality: Left;  . MOUTH SURGERY     wisdom teeth removed  . ORIF CLAVICLE FRACTURE Right 07/14/2015  . ORIF CLAVICULAR FRACTURE Right 07/14/2015   Procedure: OPEN REDUCTION INTERNAL FIXATION (ORIF) RIGHT CLAVICLE FRACTURE, RIGHT CARPAL TUNNEL RELEASE;  Surgeon: Meredith Pel, MD;  Location: [facility];  Service: Orthopedics;  Laterality: Right;  . SIGMOIDOSCOPY    . TONSILLECTOMY     Social History   Occupational History  . Not on file.   Social History Main Topics  . Smoking status: Never Smoker  . Smokeless tobacco: Never Used  . Alcohol use 4.2 oz/week    3 Glasses of wine, 3 Cans of beer, 1 Shots of liquor per week     Comment: occassional  . Drug use: No  . Sexual activity: Not on file

## 2016-05-19 DIAGNOSIS — M25562 Pain in left knee: Secondary | ICD-10-CM | POA: Diagnosis not present

## 2016-05-23 DIAGNOSIS — M25562 Pain in left knee: Secondary | ICD-10-CM | POA: Diagnosis not present

## 2016-05-31 DIAGNOSIS — M25562 Pain in left knee: Secondary | ICD-10-CM | POA: Diagnosis not present

## 2016-06-01 DIAGNOSIS — E538 Deficiency of other specified B group vitamins: Secondary | ICD-10-CM | POA: Diagnosis not present

## 2016-06-01 DIAGNOSIS — R7303 Prediabetes: Secondary | ICD-10-CM | POA: Diagnosis not present

## 2016-06-01 DIAGNOSIS — E782 Mixed hyperlipidemia: Secondary | ICD-10-CM | POA: Diagnosis not present

## 2016-06-02 DIAGNOSIS — M25562 Pain in left knee: Secondary | ICD-10-CM | POA: Diagnosis not present

## 2016-06-09 DIAGNOSIS — M25562 Pain in left knee: Secondary | ICD-10-CM | POA: Diagnosis not present

## 2016-06-09 NOTE — Addendum Note (Signed)
Addended by: Daylene Posey T on: 06/09/2016 09:38 AM   Modules accepted: Orders

## 2016-06-14 DIAGNOSIS — M25562 Pain in left knee: Secondary | ICD-10-CM | POA: Diagnosis not present

## 2016-06-15 DIAGNOSIS — E782 Mixed hyperlipidemia: Secondary | ICD-10-CM | POA: Diagnosis not present

## 2016-06-15 DIAGNOSIS — R7303 Prediabetes: Secondary | ICD-10-CM | POA: Diagnosis not present

## 2016-06-15 DIAGNOSIS — E538 Deficiency of other specified B group vitamins: Secondary | ICD-10-CM | POA: Diagnosis not present

## 2016-06-17 DIAGNOSIS — M25562 Pain in left knee: Secondary | ICD-10-CM | POA: Diagnosis not present

## 2016-06-20 ENCOUNTER — Other Ambulatory Visit: Payer: 59

## 2016-06-20 DIAGNOSIS — M25562 Pain in left knee: Secondary | ICD-10-CM | POA: Diagnosis not present

## 2016-06-21 ENCOUNTER — Other Ambulatory Visit: Payer: 59

## 2016-06-21 ENCOUNTER — Inpatient Hospital Stay: Admission: RE | Admit: 2016-06-21 | Payer: 59 | Source: Ambulatory Visit

## 2016-06-21 DIAGNOSIS — R55 Syncope and collapse: Secondary | ICD-10-CM | POA: Diagnosis not present

## 2016-06-21 DIAGNOSIS — R079 Chest pain, unspecified: Secondary | ICD-10-CM | POA: Diagnosis not present

## 2016-06-21 DIAGNOSIS — R42 Dizziness and giddiness: Secondary | ICD-10-CM | POA: Diagnosis not present

## 2016-06-21 DIAGNOSIS — E785 Hyperlipidemia, unspecified: Secondary | ICD-10-CM | POA: Diagnosis not present

## 2016-06-21 DIAGNOSIS — R61 Generalized hyperhidrosis: Secondary | ICD-10-CM | POA: Diagnosis not present

## 2016-06-21 DIAGNOSIS — R918 Other nonspecific abnormal finding of lung field: Secondary | ICD-10-CM | POA: Diagnosis not present

## 2016-06-24 ENCOUNTER — Encounter: Payer: Self-pay | Admitting: *Deleted

## 2016-06-24 DIAGNOSIS — M25562 Pain in left knee: Secondary | ICD-10-CM | POA: Diagnosis not present

## 2016-06-29 ENCOUNTER — Encounter (INDEPENDENT_AMBULATORY_CARE_PROVIDER_SITE_OTHER): Payer: Self-pay | Admitting: Orthopedic Surgery

## 2016-06-29 ENCOUNTER — Ambulatory Visit (INDEPENDENT_AMBULATORY_CARE_PROVIDER_SITE_OTHER): Payer: 59 | Admitting: Orthopedic Surgery

## 2016-06-29 DIAGNOSIS — S83242D Other tear of medial meniscus, current injury, left knee, subsequent encounter: Secondary | ICD-10-CM

## 2016-06-29 NOTE — Progress Notes (Signed)
Office Visit Note   Patient: Adam Stephenson           Date of Birth: May 08, 1965           MRN: [id number] Visit Date: 06/29/2016 Requested by: Shirline Frees, MD [address] [place], [place] [address] PCP: Shirline Frees, MD  Subjective: Chief Complaint  Patient presents with  . Left Knee - Follow-up    HPI: Adam Stephenson is a 51 year old patient with bilateral palmar masses.  His MRI scan of the right wrist pending.  MRI scan is later this month.  He also is here to follow up his left knee meniscal root repair.  In regards to his left knee the patient states he has good and bad days.  He has finished physical therapy.  Takes occasional ibuprofen.  General he is functional with the left knee.  He initially lost weight prior to his surgery but that weight loss has stabilized and not improved since surgery.  He voices understanding of the importance of weight loss.              ROS: All systems reviewed are negative as they relate to the chief complaint within the history of present illness.  Patient denies  fevers or chills.   Assessment & Plan: Visit Diagnoses:  1. Other tear of medial meniscus, current injury, left knee, subsequent encounter     Plan: Impression is reasonably functional left knee following meniscal root repair.  Trace effusion is present but it's not as much as he has had in the past.  He's having some occasional mechanical symptoms which are sporadically symptomatic but in general the left knee is functional and doing well.  I think if the repair fails then he will likely progress to medial compartment arthritis which will require knee replacement.  That will not be ideal for him at his current age and body mass index.  I'll see him back after scan on his wrist.  That mass may be a true mass near the ulnar nerve for it could be more of an early fibrous cord of the palmar fascia   Follow-Up Instructions: Return for after MRI.   Orders:  No orders of the  defined types were placed in this encounter.  No orders of the defined types were placed in this encounter.     Procedures: No procedures performed   Clinical Data: No additional findings.  Objective: Vital Signs: There were no vitals taken for this visit.  Physical Exam:   Constitutional: Patient appears well-developed HEENT:  Head: Normocephalic Eyes:EOM are normal Neck: Normal range of motion Cardiovascular: Normal rate Pulmonary/chest: Effort normal Neurologic: Patient is alert Skin: Skin is warm Psychiatric: Patient has normal mood and affect    Ortho Exam: Orthopedic exam demonstrates pretty normal gait.  Trace effusion of the left knee with good range of motion and excellent quad strength.  No real focal joint line tenderness is present.  Collateral crucial ligaments are stable.  Both hands are examined.  Well-healed carpal tunnel release incisions are present.  He does have slight the palmar mass between the proximal and distal palmar flexion creases near the ulnar aspect and hyperthenar eminence of both hands.  Wrist range of motion is normal.  It is no abductor pollicis brevis wasting or hyperthenar eminence wasting on either hand.  Specialty Comments:  No specialty comments available.  Imaging: No results found.   PMFS History: Patient Active Problem List   Diagnosis Date Noted  .  Other tear of medial meniscus, current injury, left knee, subsequent encounter 03/02/2016  . Clavicle fracture 07/14/2015   Past Medical History:  Diagnosis Date  . Anxiety   . Complication of anesthesia     2007- Colonoscopy had to be stopped due not breathing, 2016- at [facility]- BP pressure bottomed  . Depression   . Dysrhythmia   . GERD (gastroesophageal reflux disease)   . Headache   . History of kidney stones    x2  passed  . Hyperlipidemia   . Hypertension   . Sleep apnea    USES CPAP  . TBI (traumatic brain injury) ([facility]) 2002  . Tinea versicolor    arm    No  family history on file.  Past Surgical History:  Procedure Laterality Date  . ADENOIDECTOMY    . CARPAL TUNNEL RELEASE Right 07/14/2015   Procedure: CARPAL TUNNEL RELEASE;  Surgeon: Meredith Pel, MD;  Location: [facility];  Service: Orthopedics;  Laterality: Right;  . carpel tunnel left Left   . CHOLECYSTECTOMY  1994  . CLAVICLE HARDWARE REMOVAL Left 03/2009  . CLAVICLE SURGERY Left 07/2008  . COLONOSCOPY     polyp  . ESOPHAGOGASTRODUODENOSCOPY ENDOSCOPY    . FINGER GANGLION CYST EXCISION Right   . HAND SURGERY Right    ring finger - cyst  . HEMORRHOID SURGERY  2016  . KNEE ARTHROSCOPY WITH MENISCAL REPAIR Left 02/01/2016   Procedure: KNEE ARTHROSCOPY WITH MENISCAL ROOT REPAIR VS DEBRIDEMENT;  Surgeon: Meredith Pel, MD;  Location: [facility];  Service: Orthopedics;  Laterality: Left;  . MOUTH SURGERY     wisdom teeth removed  . ORIF CLAVICLE FRACTURE Right 07/14/2015  . ORIF CLAVICULAR FRACTURE Right 07/14/2015   Procedure: OPEN REDUCTION INTERNAL FIXATION (ORIF) RIGHT CLAVICLE FRACTURE, RIGHT CARPAL TUNNEL RELEASE;  Surgeon: Meredith Pel, MD;  Location: [facility];  Service: Orthopedics;  Laterality: Right;  . SIGMOIDOSCOPY    . TONSILLECTOMY     Social History   Occupational History  . Not on file.   Social History Main Topics  . Smoking status: Never Smoker  . Smokeless tobacco: Never Used  . Alcohol use 4.2 oz/week    3 Glasses of wine, 3 Cans of beer, 1 Shots of liquor per week     Comment: occassional  . Drug use: No  . Sexual activity: Not on file

## 2016-07-04 ENCOUNTER — Ambulatory Visit
Admission: RE | Admit: 2016-07-04 | Discharge: 2016-07-04 | Disposition: A | Discharge status: Home / Self Care | Payer: 59 | Source: Ambulatory Visit | Attending: Orthopedic Surgery | Admitting: Orthopedic Surgery

## 2016-07-04 DIAGNOSIS — R2231 Localized swelling, mass and lump, right upper limb: Secondary | ICD-10-CM

## 2016-07-04 MED ORDER — GADOBENATE DIMEGLUMINE 529 MG/ML IV SOLN
20.0000 mL | Freq: Once | INTRAVENOUS | Status: AC | PRN
  Administered 2016-07-04: 20 mL via INTRAVENOUS

## 2016-07-11 ENCOUNTER — Ambulatory Visit (INDEPENDENT_AMBULATORY_CARE_PROVIDER_SITE_OTHER): Payer: 59 | Admitting: Orthopedic Surgery

## 2016-07-11 ENCOUNTER — Ambulatory Visit (INDEPENDENT_AMBULATORY_CARE_PROVIDER_SITE_OTHER): Payer: 59 | Admitting: Podiatry

## 2016-07-11 ENCOUNTER — Encounter (INDEPENDENT_AMBULATORY_CARE_PROVIDER_SITE_OTHER): Payer: Self-pay | Admitting: Orthopedic Surgery

## 2016-07-11 ENCOUNTER — Encounter: Payer: Self-pay | Admitting: Podiatry

## 2016-07-11 DIAGNOSIS — B351 Tinea unguium: Secondary | ICD-10-CM | POA: Diagnosis not present

## 2016-07-11 DIAGNOSIS — M79675 Pain in left toe(s): Secondary | ICD-10-CM | POA: Diagnosis not present

## 2016-07-11 DIAGNOSIS — M79674 Pain in right toe(s): Secondary | ICD-10-CM | POA: Diagnosis not present

## 2016-07-11 DIAGNOSIS — M25531 Pain in right wrist: Secondary | ICD-10-CM | POA: Diagnosis not present

## 2016-07-11 NOTE — Progress Notes (Signed)
Office Visit Note   Patient: Adam Stephenson           Date of Birth: 08-18-1965           MRN: [id number] Visit Date: 07/11/2016 Requested by: Shirline Frees, MD [address], [place] [address] PCP: Shirline Frees, MD  Subjective: Chief Complaint  Patient presents with  . Right Wrist - Follow-up    HPI: Adam Stephenson Level is a 51 year old patient with right and left wrist pain.  Since of series had an MRI scan to look at a palmar mass.  This palmar mass as described in the MRI report and as visualized when we both looked at it is more of subcutaneous tissue thickening and not a discrete mass.  He's having some numbness in both hands as well.  He's had bilateral carpal tunnel release.  Did very well after the surgery but now is having some recurrent numbness in both hands.  Denies any neck symptoms.  Is not really taking anything for the pain.  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              ROS: All systems reviewed are negative as they relate to the chief complaint within the history of present illness.  Patient denies  fevers or chills.   Assessment & Plan: Visit Diagnoses:  1. Pain in right wrist     Plan: Impression is right and left palmar sided pain with no definitively operative mass.  There is some subcutaneous tissue thickening.  I think if this were removed there is very little surrounding tissue pliability to close and thus it would have to heal with secondary intention.  I would be a long process for Bruce at this time.  Reversed and numbness in both hands this soft tissue thickening is not really particular close to any neurovascular bundles.  Could consider repeat nerve conduction study testing but in general after further discussion the numbness and tingling is not really as bad as it was preoperatively.  I think clinical recheck in 6 months is indicated with possible repeat imaging at that time if this mass seems  to be growing.  Follow-Up Instructions: Return in about 6 months (around 01/10/2017).   Orders:  No orders of the defined types were placed in this encounter.  No orders of the defined types were placed in this encounter.     Procedures: No procedures performed   Clinical Data: No additional findings.  Objective: Vital Signs: There were no vitals taken for this visit.  Physical Exam:   Constitutional: Patient appears well-developed HEENT:  Head: Normocephalic Eyes:EOM are normal Neck: Normal range of motion Cardiovascular: Normal rate Pulmonary/chest: Effort normal Neurologic: Patient is alert Skin: Skin is warm Psychiatric: Patient has normal mood and affect    Ortho Exam: Orthopedic exam demonstrates full active and passive range of motion of the hand and wrist.  Between the proximal and distal hyperthenar and metacarpal flexion creases there is a tissue fullness measuring about 8 x 8 mm.  There is no wasting of the intrinsic muscles and the patient has very good interosseous function.  Radial pulses intact.  Wrist range of motion otherwise full.  Specialty Comments:  No specialty comments available.  Imaging: No results found.   PMFS History: Patient Active Problem List   Diagnosis Date Noted  . Other tear of medial meniscus, current injury, left knee, subsequent encounter 03/02/2016  .  Clavicle fracture 07/14/2015   Past Medical History:  Diagnosis Date  . Anxiety   . Complication of anesthesia     2007- Colonoscopy had to be stopped due not breathing, 2016- at [facility]- BP pressure bottomed  . Depression   . Dysrhythmia   . GERD (gastroesophageal reflux disease)   . Headache   . History of kidney stones    x2  passed  . Hyperlipidemia   . Hypertension   . Sleep apnea    USES CPAP  . TBI (traumatic brain injury) ([facility]) 2002  . Tinea versicolor    arm    No family history on file.  Past Surgical History:  Procedure Laterality Date  .  ADENOIDECTOMY    . CARPAL TUNNEL RELEASE Right 07/14/2015   Procedure: CARPAL TUNNEL RELEASE;  Surgeon: Meredith Pel, MD;  Location: [facility];  Service: Orthopedics;  Laterality: Right;  . carpel tunnel left Left   . CHOLECYSTECTOMY  1994  . CLAVICLE HARDWARE REMOVAL Left 03/2009  . CLAVICLE SURGERY Left 07/2008  . COLONOSCOPY     polyp  . ESOPHAGOGASTRODUODENOSCOPY ENDOSCOPY    . FINGER GANGLION CYST EXCISION Right   . HAND SURGERY Right    ring finger - cyst  . HEMORRHOID SURGERY  2016  . KNEE ARTHROSCOPY WITH MENISCAL REPAIR Left 02/01/2016   Procedure: KNEE ARTHROSCOPY WITH MENISCAL ROOT REPAIR VS DEBRIDEMENT;  Surgeon: Meredith Pel, MD;  Location: [facility];  Service: Orthopedics;  Laterality: Left;  . MOUTH SURGERY     wisdom teeth removed  . ORIF CLAVICLE FRACTURE Right 07/14/2015  . ORIF CLAVICULAR FRACTURE Right 07/14/2015   Procedure: OPEN REDUCTION INTERNAL FIXATION (ORIF) RIGHT CLAVICLE FRACTURE, RIGHT CARPAL TUNNEL RELEASE;  Surgeon: Meredith Pel, MD;  Location: [facility];  Service: Orthopedics;  Laterality: Right;  . SIGMOIDOSCOPY    . TONSILLECTOMY     Social History   Occupational History  . Not on file.   Social History Main Topics  . Smoking status: Never Smoker  . Smokeless tobacco: Never Used  . Alcohol use 4.2 oz/week    3 Glasses of wine, 3 Cans of beer, 1 Shots of liquor per week     Comment: occassional  . Drug use: No  . Sexual activity: Not on file

## 2016-07-12 NOTE — Progress Notes (Signed)
Chief Complaint  Patient presents with  . Dizziness   History of Present Illness: 51 yo male with history of vertigo, GERD, HLD, HTN, OSA who is referred today by Dr. Kenton Kingfisher as a consult for evaluation of dizziness. He has no known heart disease. He was seen in the ED 3 weeks ago with dizziness while lying in bed. No palpitations. No diaphoresis, chest pain or dyspnea. EKG with sinus bradycardia, no ischemic changes. He has had no recurrence since. He does not exercise. BP has been slightly elevated at home.   Referring:[facility] Primary Care Physician: Shirline Frees, MD  Past Medical History:  Diagnosis Date  . Anxiety   . Complication of anesthesia     2007- Colonoscopy had to be stopped due not breathing, 2016- at [facility]- BP pressure bottomed  . Depression   . Dysrhythmia   . GERD (gastroesophageal reflux disease)   . Headache   . History of kidney stones    x2  passed  . Hyperlipidemia   . Hypertension   . Sleep apnea    USES CPAP  . TBI (traumatic brain injury) ([place]) 2002  . Tinea versicolor    arm    Past Surgical History:  Procedure Laterality Date  . ADENOIDECTOMY    . CARPAL TUNNEL RELEASE Right 07/14/2015   Procedure: CARPAL TUNNEL RELEASE;  Surgeon: Meredith Pel, MD;  Location: [facility];  Service: Orthopedics;  Laterality: Right;  . carpel tunnel left Left   . CHOLECYSTECTOMY  1994  . CLAVICLE HARDWARE REMOVAL Left 03/2009  . CLAVICLE SURGERY Left 07/2008  . COLONOSCOPY     polyp  . ESOPHAGOGASTRODUODENOSCOPY ENDOSCOPY    . FINGER GANGLION CYST EXCISION Right   . HAND SURGERY Right    ring finger - cyst  . HEMORRHOID SURGERY  2016  . KNEE ARTHROSCOPY WITH MENISCAL REPAIR Left 02/01/2016   Procedure: KNEE ARTHROSCOPY WITH MENISCAL ROOT REPAIR VS DEBRIDEMENT;  Surgeon: Meredith Pel, MD;  Location: [facility];  Service: Orthopedics;  Laterality: Left;  . MOUTH SURGERY     wisdom teeth removed  . ORIF CLAVICLE FRACTURE Right 07/14/2015  . ORIF  CLAVICULAR FRACTURE Right 07/14/2015   Procedure: OPEN REDUCTION INTERNAL FIXATION (ORIF) RIGHT CLAVICLE FRACTURE, RIGHT CARPAL TUNNEL RELEASE;  Surgeon: Meredith Pel, MD;  Location: [facility];  Service: Orthopedics;  Laterality: Right;  . SIGMOIDOSCOPY    . TONSILLECTOMY      Current Outpatient Prescriptions  Medication Sig Dispense Refill  . atorvastatin (LIPITOR) 40 MG tablet Take 40 mg by mouth every morning.     . hydrochlorothiazide (HYDRODIURIL) 25 MG tablet Take 25 mg by mouth every morning.     . irbesartan (AVAPRO) 300 MG tablet Take 300 mg by mouth every morning.     . pantoprazole (PROTONIX) 40 MG tablet Take 40 mg by mouth every morning.      No current facility-administered medications for this visit.     No Known Allergies  Social History   Social History  . Marital status: Single    Spouse name: N/A  . Number of children: 0  . Years of education: N/A   Occupational History  . Retired-police    Social History Main Topics  . Smoking status: Never Smoker  . Smokeless tobacco: Never Used  . Alcohol use 4.2 oz/week    3 Glasses of wine, 3 Cans of beer, 1 Shots of liquor per week     Comment: occassional  . Drug use: No  .  Sexual activity: Not on file   Other Topics Concern  . Not on file   Social History Narrative  . No narrative on file    Family History  Problem Relation Age of Onset  . CAD Father   . CAD Paternal Grandfather     Review of Systems:  As stated in the HPI and otherwise negative.   BP 120/80 (BP Location: Right Arm, Patient Position: Sitting, Cuff Size: Large)   Pulse 76   Ht 6' (1.829 m)   Wt (!) 345 lb 12.8 oz (156.9 kg)   SpO2 94%   BMI 46.90 kg/m   Physical Examination: General: Well developed, well nourished, NAD  HEENT: OP clear, mucus membranes moist  SKIN: warm, dry. No rashes. Neuro: No focal deficits  Musculoskeletal: Muscle strength 5/5 all ext  Psychiatric: Mood and affect normal  Neck: No JVD, no carotid  bruits, no thyromegaly, no lymphadenopathy.  Lungs:Clear bilaterally, no wheezes, rhonci, crackles Cardiovascular: Regular rate and rhythm. No murmurs, gallops or rubs. Abdomen:Soft. Bowel sounds present. Non-tender.  Extremities: No lower extremity edema. Pulses are 2 + in the bilateral DP/PT.  EKG:  EKG is not ordered today. The ekg from 06/21/16 from [facility] is reviewed by me today and shows sinus bradycardia.   Recent Labs: 01/22/2016: BUN 14; Creatinine, Ser 0.93; Hemoglobin 14.3; Platelets 217; Potassium 3.7; Sodium 138   Lipid Panel No results found for: CHOL, TRIG, HDL, CHOLHDL, VLDL, LDLCALC, LDLDIRECT   Wt Readings from Last 3 Encounters:  07/13/16 (!) 345 lb 12.8 oz (156.9 kg)  02/01/16 (!) 330 lb 3.2 oz (149.8 kg)  01/22/16 (!) 330 lb 3.2 oz (149.8 kg)     Other studies Reviewed: Additional studies/ records that were reviewed today include: . Review of the above records demonstrates:   Assessment and Plan:   1. Dizziness: Unclear etiology. Exam overall normal. No carotid bruits. No recent illnesses. He is obese with risk factors for CAD including HTN and HLD. Will arrange echo to assess LVEF and exclude structural heart disease. Exercise stress test to exclude ischemia.   Current medicines are reviewed at length with the patient today.  The patient does not have concerns regarding medicines.  The following changes have been made:  no change  Labs/ tests ordered today include:   Orders Placed This Encounter  Procedures  . Exercise Tolerance Test  . ECHOCARDIOGRAM COMPLETE   Disposition:   FU with me in 6 weeks  Signed, Lauree Chandler, MD 07/13/2016 10:09 AM    [facility] [place], [place],   [address] Phone: [phone]; Fax: [phone]

## 2016-07-13 ENCOUNTER — Encounter: Payer: Self-pay | Admitting: Cardiovascular Disease

## 2016-07-13 ENCOUNTER — Ambulatory Visit (INDEPENDENT_AMBULATORY_CARE_PROVIDER_SITE_OTHER): Payer: 59 | Admitting: Cardiovascular Disease

## 2016-07-13 VITALS — BP 120/80 | HR 76 | Ht 72.0 in | Wt 345.8 lb

## 2016-07-13 DIAGNOSIS — R42 Dizziness and giddiness: Secondary | ICD-10-CM | POA: Diagnosis not present

## 2016-07-13 NOTE — Patient Instructions (Signed)
Your physician recommends that you continue on your current medications as directed. Please refer to the Current Medication list given to you today.  Your physician has requested that you have an echocardiogram. Echocardiography is a painless test that uses sound waves to create images of your heart. It provides your doctor with information about the size and shape of your heart and how well your heart's chambers and valves are working. This procedure takes approximately one hour. There are no restrictions for this procedure.    Your physician has requested that you have an exercise tolerance test. For further information please visit [email or web address]. Please also follow instruction sheet, as given.   Your physician recommends that you schedule a follow-up appointment in:  [place]

## 2016-07-13 NOTE — Progress Notes (Signed)
Subjective: 51 y.o. returns the office today for painful, elongated, thickened toenails which they cannot trim themself. Denies any redness or drainage around the nails. He has continued with the topical anti-fungal. Denies any acute changes since last appointment and no new complaints today. Denies any systemic complaints such as fevers, chills, nausea, vomiting.   Objective: AAO 3, NAD DP/PT pulses palpable, CRT less than 3 seconds Nails hypertrophic, dystrophic, elongated, brittle, discolored 2. There is tenderness overlying the hallux nails bilaterally. There is no surrounding erythema or drainage along the nail sites. No open lesions or pre-ulcerative lesions are identified. No other areas of tenderness bilateral lower extremities. No overlying edema, erythema, increased warmth. No pain with calf compression, swelling, warmth, erythema.  Assessment: Patient presents with symptomatic onychomycosis  Plan: -Treatment options including alternatives, risks, complications were discussed -Nails sharply debrided 2 without complication/bleeding. The right hallux nail is loose from the underlying nail bed.  -Continue with topical anti-fungal.  -Discussed daily foot inspection. If there are any changes, to call the office immediately.  -Follow-up in 3 months or sooner if any problems are to arise. In the meantime, encouraged to call the office with any questions, concerns, changes symptoms.  Celesta Gentile, DPM

## 2016-07-27 ENCOUNTER — Ambulatory Visit (INDEPENDENT_AMBULATORY_CARE_PROVIDER_SITE_OTHER): Payer: 59

## 2016-07-27 ENCOUNTER — Ambulatory Visit (HOSPITAL_COMMUNITY): Payer: 59

## 2016-07-27 ENCOUNTER — Other Ambulatory Visit: Payer: Self-pay | Admitting: *Deleted

## 2016-07-27 DIAGNOSIS — R42 Dizziness and giddiness: Secondary | ICD-10-CM

## 2016-07-27 LAB — EXERCISE TOLERANCE TEST
CSEPED: 2 min
CSEPHR: 76 %
CSEPPHR: 127 {beats}/min
Estimated workload: 4.6 METS
Exercise duration (sec): 52 s
MPHR: 167 {beats}/min
RPE: 12
Rest HR: 62 {beats}/min

## 2016-07-28 ENCOUNTER — Telehealth (HOSPITAL_COMMUNITY): Payer: Self-pay | Admitting: Cardiovascular Disease

## 2016-07-29 NOTE — Telephone Encounter (Signed)
07/28/2016 02:11 PM Phone ([place]) Adam Stephenson, Adam Stephenson (Self) [phone] (H)   Left Message - Called pt and lmsg for him to CB to schedule a myoview appt.     By Verdene Rio

## 2016-08-08 ENCOUNTER — Other Ambulatory Visit: Payer: Self-pay

## 2016-08-08 ENCOUNTER — Ambulatory Visit (HOSPITAL_COMMUNITY): Payer: 59 | Attending: Cardiology

## 2016-08-08 DIAGNOSIS — I517 Cardiomegaly: Secondary | ICD-10-CM | POA: Insufficient documentation

## 2016-08-08 DIAGNOSIS — R42 Dizziness and giddiness: Secondary | ICD-10-CM | POA: Diagnosis not present

## 2016-08-08 MED ORDER — PERFLUTREN LIPID MICROSPHERE
1.0000 mL | INTRAVENOUS | Status: AC | PRN
  Administered 2016-08-08: 2 mL via INTRAVENOUS

## 2016-08-11 ENCOUNTER — Telehealth (HOSPITAL_COMMUNITY): Payer: Self-pay | Admitting: *Deleted

## 2016-08-11 NOTE — Telephone Encounter (Signed)
Patient given detailed instructions per Myocardial Perfusion Study Information Sheet for the test on 08/16/16 Patient notified to arrive 15 minutes early and that it is imperative to arrive on time for appointment to keep from having the test rescheduled.  If you need to cancel or reschedule your appointment, please call the office within 24 hours of your appointment. . Patient verbalized understanding. [place], Cambell Stanek Jacqueline    

## 2016-08-16 ENCOUNTER — Ambulatory Visit (HOSPITAL_COMMUNITY): Payer: 59 | Attending: Cardiovascular Disease

## 2016-08-16 DIAGNOSIS — R42 Dizziness and giddiness: Secondary | ICD-10-CM | POA: Insufficient documentation

## 2016-08-16 MED ORDER — REGADENOSON 0.4 MG/5ML IV SOLN
0.4000 mg | Freq: Once | INTRAVENOUS | Status: AC
  Administered 2016-08-16: 0.4 mg via INTRAVENOUS

## 2016-08-16 MED ORDER — TECHNETIUM TC 99M TETROFOSMIN IV KIT
33.0000 | PACK | Freq: Once | INTRAVENOUS | Status: AC | PRN
  Administered 2016-08-16: 33 via INTRAVENOUS
  Filled 2016-08-16: qty 33

## 2016-08-17 ENCOUNTER — Ambulatory Visit (HOSPITAL_COMMUNITY): Payer: 59 | Attending: Cardiovascular Disease

## 2016-08-17 LAB — MYOCARDIAL PERFUSION IMAGING
CHL CUP NUCLEAR SDS: 6
CHL CUP NUCLEAR SRS: 1
CSEPPHR: 96 {beats}/min
LV dias vol: 140 mL (ref 62–150)
LV sys vol: 50 mL
RATE: 0.28
Rest HR: 71 {beats}/min
SSS: 7
TID: 1.04

## 2016-08-17 MED ORDER — TECHNETIUM TC 99M TETROFOSMIN IV KIT
32.5000 | PACK | Freq: Once | INTRAVENOUS | Status: AC | PRN
  Administered 2016-08-17: 32.5 via INTRAVENOUS
  Filled 2016-08-17: qty 33

## 2016-08-24 ENCOUNTER — Encounter: Payer: Self-pay | Admitting: Cardiovascular Disease

## 2016-08-24 ENCOUNTER — Ambulatory Visit (INDEPENDENT_AMBULATORY_CARE_PROVIDER_SITE_OTHER): Payer: 59 | Admitting: Cardiovascular Disease

## 2016-08-24 VITALS — BP 126/78 | HR 72 | Ht 72.0 in | Wt 352.8 lb

## 2016-08-24 DIAGNOSIS — R42 Dizziness and giddiness: Secondary | ICD-10-CM | POA: Diagnosis not present

## 2016-08-24 NOTE — Patient Instructions (Signed)
Medication Instructions:  Your physician recommends that you continue on your current medications as directed. Please refer to the Current Medication list given to you today.   Labwork: none  Testing/Procedures: none  Follow-Up: Your physician recommends that you schedule a follow-up appointment as needed.    Any Other Special Instructions Will Be Listed Below (If Applicable).     If you need a refill on your cardiac medications before your next appointment, please call your pharmacy.   

## 2016-08-24 NOTE — Progress Notes (Signed)
Chief Complaint  Patient presents with  . Follow-up    dizziness   History of Present Illness: 51 yo male with history of vertigo, GERD, HLD, HTN, OSA who is here today for cardiac follow up. I saw him as a new patient 07/13/16 for evaluation of dizziness. He has no known heart disease. He was seen in the ED 3 weeks prior to his first visit with me and had c/o dizziness while lying in bed. EKG at the ED visit showed sinus bradycardia. He had no palpitations, diaphoresis, chest pain or dyspnea. He has had no recurrence since. Echo 08/08/16 with normal LV systolic function. No valve disease. Nuclear stress test with no ischemia.   He is here today for follow up. The patient denies any chest pain, dyspnea, palpitations, lower extremity edema, orthopnea, PND, dizziness, near syncope or syncope.   Primary Care Physician: Shirline Frees, MD  Past Medical History:  Diagnosis Date  . Anxiety   . Complication of anesthesia     2007- Colonoscopy had to be stopped due not breathing, 2016- at [facility]- BP pressure bottomed  . Depression   . Dysrhythmia   . GERD (gastroesophageal reflux disease)   . Headache   . History of kidney stones    x2  passed  . Hyperlipidemia   . Hypertension   . Sleep apnea    USES CPAP  . TBI (traumatic brain injury) ([place]) 2002  . Tinea versicolor    arm    Past Surgical History:  Procedure Laterality Date  . ADENOIDECTOMY    . CARPAL TUNNEL RELEASE Right 07/14/2015   Procedure: CARPAL TUNNEL RELEASE;  Surgeon: Meredith Pel, MD;  Location: [facility];  Service: Orthopedics;  Laterality: Right;  . carpel tunnel left Left   . CHOLECYSTECTOMY  1994  . CLAVICLE HARDWARE REMOVAL Left 03/2009  . CLAVICLE SURGERY Left 07/2008  . COLONOSCOPY     polyp  . ESOPHAGOGASTRODUODENOSCOPY ENDOSCOPY    . FINGER GANGLION CYST EXCISION Right   . HAND SURGERY Right    ring finger - cyst  . HEMORRHOID SURGERY  2016  . KNEE ARTHROSCOPY WITH MENISCAL REPAIR Left 02/01/2016     Procedure: KNEE ARTHROSCOPY WITH MENISCAL ROOT REPAIR VS DEBRIDEMENT;  Surgeon: Meredith Pel, MD;  Location: [facility];  Service: Orthopedics;  Laterality: Left;  . MOUTH SURGERY     wisdom teeth removed  . ORIF CLAVICLE FRACTURE Right 07/14/2015  . ORIF CLAVICULAR FRACTURE Right 07/14/2015   Procedure: OPEN REDUCTION INTERNAL FIXATION (ORIF) RIGHT CLAVICLE FRACTURE, RIGHT CARPAL TUNNEL RELEASE;  Surgeon: Meredith Pel, MD;  Location: [facility];  Service: Orthopedics;  Laterality: Right;  . SIGMOIDOSCOPY    . TONSILLECTOMY      Current Outpatient Prescriptions  Medication Sig Dispense Refill  . atorvastatin (LIPITOR) 40 MG tablet Take 40 mg by mouth every morning.     . hydrochlorothiazide (HYDRODIURIL) 25 MG tablet Take 25 mg by mouth every morning.     . irbesartan (AVAPRO) 300 MG tablet Take 300 mg by mouth every morning.     . pantoprazole (PROTONIX) 40 MG tablet Take 40 mg by mouth every morning.      No current facility-administered medications for this visit.     No Known Allergies  Social History   Social History  . Marital status: Single    Spouse name: N/A  . Number of children: 0  . Years of education: N/A   Occupational History  . Retired-police  Social History Main Topics  . Smoking status: Never Smoker  . Smokeless tobacco: Never Used  . Alcohol use 4.2 oz/week    3 Glasses of wine, 3 Cans of beer, 1 Shots of liquor per week     Comment: occassional  . Drug use: No  . Sexual activity: Not on file   Other Topics Concern  . Not on file   Social History Narrative  . No narrative on file    Family History  Problem Relation Age of Onset  . CAD Father   . CAD Paternal Grandfather     Review of Systems:  As stated in the HPI and otherwise negative.   BP 126/78   Pulse 72   Ht 6' (1.829 m)   Wt (!) 352 lb 12.8 oz (160 kg)   SpO2 95%   BMI 47.85 kg/m   Physical Examination:  General: Well developed, well nourished, NAD  HEENT: OP clear,  mucus membranes moist  SKIN: warm, dry. No rashes. Neuro: No focal deficits  Musculoskeletal: Muscle strength 5/5 all ext  Psychiatric: Mood and affect normal  Neck: No JVD, no carotid bruits, no thyromegaly, no lymphadenopathy.  Lungs:Clear bilaterally, no wheezes, rhonci, crackles Cardiovascular: Regular rate and rhythm. No murmurs, gallops or rubs. Abdomen:Soft. Bowel sounds present. Non-tender.  Extremities: No lower extremity edema. Pulses are 2 + in the bilateral DP/PT.  Echo 08/08/16: Left ventricle: The cavity size was normal. There was mild   concentric hypertrophy. Systolic function was vigorous. The   estimated ejection fraction was in the range of 65% to 70%. Wall   motion was normal; there were no regional wall motion   abnormalities. Doppler parameters are consistent with abnormal   left ventricular relaxation (grade 1 diastolic dysfunction).   There was no evidence of elevated ventricular filling pressure by   Doppler parameters. - Aortic valve: Trileaflet; normal thickness leaflets. There was no   regurgitation. - Ascending aorta: The ascending aorta was mildly dilated measuring   40 mm. - Mitral valve: There was no regurgitation. - Left atrium: The atrium was moderately dilated. - Right ventricle: The cavity size was normal. Wall thickness was   normal. Systolic function was normal. - Tricuspid valve: There was no regurgitation. - Pulmonary arteries: Systolic pressure could not be accurately   estimated. - Inferior vena cava: The vessel was normal in size.  Stress myoview 08/17/16:  Nuclear stress EF: 64%.  There was no ST segment deviation noted during stress.  The left ventricular ejection fraction is normal (55-65%).  The study is normal.   1. Normal LV systolic function and wall motion.  2. No evidence for ischemia or infarction by perfusion images.   EKG:  EKG is not ordered today.  Recent Labs: 01/22/2016: BUN 14; Creatinine, Ser 0.93; Hemoglobin  14.3; Platelets 217; Potassium 3.7; Sodium 138   Lipid Panel No results found for: CHOL, TRIG, HDL, CHOLHDL, VLDL, LDLCALC, LDLDIRECT   Wt Readings from Last 3 Encounters:  08/24/16 (!) 352 lb 12.8 oz (160 kg)  08/16/16 (!) 345 lb (156.5 kg)  07/13/16 (!) 345 lb 12.8 oz (156.9 kg)     Other studies Reviewed: Additional studies/ records that were reviewed today include: . Review of the above records demonstrates:   Assessment and Plan:   1. Dizziness: He has normal LV function by echo. Nuclear stress test with no ischemia. Dizziness has resolved. NO further workup.   Current medicines are reviewed at length with the patient today.  The  patient does not have concerns regarding medicines.  The following changes have been made:  no change  Labs/ tests ordered today include:   No orders of the defined types were placed in this encounter.  Disposition:   FU with me as needed.   Signed, Lauree Chandler, MD 08/24/2016 5:17 PM    [facility] [address], [place], [place]  [address] Phone: [phone]; Fax: [phone]

## 2016-10-10 ENCOUNTER — Ambulatory Visit: Payer: 59 | Admitting: Podiatry

## 2016-10-28 ENCOUNTER — Ambulatory Visit: Payer: 59 | Admitting: Podiatry

## 2016-11-04 DIAGNOSIS — E7800 Pure hypercholesterolemia, unspecified: Secondary | ICD-10-CM | POA: Diagnosis not present

## 2016-11-04 DIAGNOSIS — I10 Essential (primary) hypertension: Secondary | ICD-10-CM | POA: Diagnosis not present

## 2016-11-04 DIAGNOSIS — K219 Gastro-esophageal reflux disease without esophagitis: Secondary | ICD-10-CM | POA: Diagnosis not present

## 2016-11-07 ENCOUNTER — Encounter: Payer: Self-pay | Admitting: Podiatry

## 2016-11-07 ENCOUNTER — Ambulatory Visit (INDEPENDENT_AMBULATORY_CARE_PROVIDER_SITE_OTHER): Payer: 59 | Admitting: Podiatry

## 2016-11-07 DIAGNOSIS — M79674 Pain in right toe(s): Secondary | ICD-10-CM | POA: Diagnosis not present

## 2016-11-07 DIAGNOSIS — M79675 Pain in left toe(s): Secondary | ICD-10-CM | POA: Diagnosis not present

## 2016-11-07 DIAGNOSIS — B351 Tinea unguium: Secondary | ICD-10-CM | POA: Diagnosis not present

## 2016-11-09 NOTE — Progress Notes (Signed)
Subjective: 51 y.o. returns the office today for painful, elongated, thickened toenails which they cannot trim themself. Denies any redness or drainage around the nails. He has continued with the topical anti-fungal. Denies any acute changes since last appointment and no new complaints today. Denies any systemic complaints such as fevers, chills, nausea, vomiting.   Objective: AAO 3, NAD DP/PT pulses palpable, CRT less than 3 seconds Nails hypertrophic, dystrophic, elongated, brittle, discolored 2. There is tenderness overlying the hallux nails bilaterally. There is no surrounding erythema or drainage along the nail sites. No open lesions or pre-ulcerative lesions are identified. No other areas of tenderness bilateral lower extremities. No overlying edema, erythema, increased warmth. No pain with calf compression, swelling, warmth, erythema.  Assessment: Patient presents with symptomatic onychomycosis  Plan: -Treatment options including alternatives, risks, complications were discussed -Nails sharply debrided 2 without complication/bleeding.  -Continue with topical anti-fungal.  -Discussed daily foot inspection. If there are any changes, to call the office immediately.  -Follow-up in 3 months or sooner if any problems are to arise. In the meantime, encouraged to call the office with any questions, concerns, changes symptoms.  Celesta Gentile, DPM

## 2016-11-10 ENCOUNTER — Ambulatory Visit (INDEPENDENT_AMBULATORY_CARE_PROVIDER_SITE_OTHER): Payer: 59 | Admitting: Orthopedic Surgery

## 2016-11-10 ENCOUNTER — Encounter (INDEPENDENT_AMBULATORY_CARE_PROVIDER_SITE_OTHER): Payer: Self-pay | Admitting: Orthopedic Surgery

## 2016-11-10 DIAGNOSIS — M25562 Pain in left knee: Secondary | ICD-10-CM | POA: Diagnosis not present

## 2016-11-10 DIAGNOSIS — G8929 Other chronic pain: Secondary | ICD-10-CM | POA: Diagnosis not present

## 2016-11-10 NOTE — Progress Notes (Signed)
Office Visit Note   Patient: Adam Stephenson           Date of Birth: 04/11/1965           MRN: [id number] Visit Date: 11/10/2016 Requested by: Shirline Frees, MD [place], [place] [address] PCP: Shirline Frees, MD  Subjective: Chief Complaint  Patient presents with  . Left Knee - Pain    HPI: Adam Stephenson is a 51 year old patient with left knee pain.  He had meniscal root repair done several months ago.  Denies any pain at rest and he was doing very well but over the past 6 weeks he reports increasing pain with some popping.  Does describe most of his pain in the anterior aspect of the knee.  Does have some stiffness after sitting.  Takes ibuprofen for pain.  His weight has increased from 310 at 350.  His pain is not necessarily worse with stairs.  Getting up after a prolonged period of sitting is difficult.              ROS: All systems reviewed are negative as they relate to the chief complaint within the history of present illness.  Patient denies  fevers or chills.   Assessment & Plan: Visit Diagnoses:  1. Chronic pain of left knee     Plan: Impression is left knee pain possibly from progressive arthritis or possibly from failure of the meniscal root repair.  His weight is a factor.  Plan MRI scan to evaluate meniscal root repair.  He does have an effusion in the knee today but is not as much as he has had in the past.  We'll see him back after that study is somewhat encouraging that most of his pain is anterior and not medial.  Follow-Up Instructions: Return for after MRI.   Orders:  Orders Placed This Encounter  Procedures  . MR Knee Left w/o contrast   No orders of the defined types were placed in this encounter.     Procedures: No procedures performed   Clinical Data: No additional findings.  Objective: Vital Signs: There were no vitals taken for this visit.  Physical Exam:   Constitutional: Patient appears well-developed HEENT:    Head: Normocephalic Eyes:EOM are normal Neck: Normal range of motion Cardiovascular: Normal rate Pulmonary/chest: Effort normal Neurologic: Patient is alert Skin: Skin is warm Psychiatric: Patient has normal mood and affect    Ortho Exam: Orthopedic exam demonstrates increased body mass index mild effusion in the left knee with good range of motion.  No focal joint line tenderness medially.  Collateral and cruciate ligaments are stable.  Does have some tenderness in the anterior aspect of the knee.  No calf tenderness is present.  No groin pain with internal/external rotation of the leg and no nerve retention signs on the left.  Specialty Comments:  No specialty comments available.  Imaging: No results found.   PMFS History: Patient Active Problem List   Diagnosis Date Noted  . Other tear of medial meniscus, current injury, left knee, subsequent encounter 03/02/2016  . Clavicle fracture 07/14/2015   Past Medical History:  Diagnosis Date  . Anxiety   . Complication of anesthesia     2007- Colonoscopy had to be stopped due not breathing, 2016- at [facility]- BP pressure bottomed  . Depression   . Dysrhythmia   . GERD (gastroesophageal reflux disease)   . Headache   . History of kidney stones    x2  passed  . Hyperlipidemia   . Hypertension   . Sleep apnea    USES CPAP  . TBI (traumatic brain injury) ([place]) 2002  . Tinea versicolor    arm    Family History  Problem Relation Age of Onset  . CAD Father   . CAD Paternal Grandfather     Past Surgical History:  Procedure Laterality Date  . ADENOIDECTOMY    . CARPAL TUNNEL RELEASE Right 07/14/2015   Procedure: CARPAL TUNNEL RELEASE;  Surgeon: Meredith Pel, MD;  Location: [facility];  Service: Orthopedics;  Laterality: Right;  . carpel tunnel left Left   . CHOLECYSTECTOMY  1994  . CLAVICLE HARDWARE REMOVAL Left 03/2009  . CLAVICLE SURGERY Left 07/2008  . COLONOSCOPY     polyp  . ESOPHAGOGASTRODUODENOSCOPY ENDOSCOPY     . FINGER GANGLION CYST EXCISION Right   . HAND SURGERY Right    ring finger - cyst  . HEMORRHOID SURGERY  2016  . KNEE ARTHROSCOPY WITH MENISCAL REPAIR Left 02/01/2016   Procedure: KNEE ARTHROSCOPY WITH MENISCAL ROOT REPAIR VS DEBRIDEMENT;  Surgeon: Meredith Pel, MD;  Location: [facility];  Service: Orthopedics;  Laterality: Left;  . MOUTH SURGERY     wisdom teeth removed  . ORIF CLAVICLE FRACTURE Right 07/14/2015  . ORIF CLAVICULAR FRACTURE Right 07/14/2015   Procedure: OPEN REDUCTION INTERNAL FIXATION (ORIF) RIGHT CLAVICLE FRACTURE, RIGHT CARPAL TUNNEL RELEASE;  Surgeon: Meredith Pel, MD;  Location: [facility];  Service: Orthopedics;  Laterality: Right;  . SIGMOIDOSCOPY    . TONSILLECTOMY     Social History   Occupational History  . Retired-police    Social History Main Topics  . Smoking status: Never Smoker  . Smokeless tobacco: Never Used  . Alcohol use 4.2 oz/week    3 Glasses of wine, 3 Cans of beer, 1 Shots of liquor per week     Comment: occassional  . Drug use: No  . Sexual activity: Not on file

## 2016-11-23 ENCOUNTER — Ambulatory Visit
Admission: RE | Admit: 2016-11-23 | Discharge: 2016-11-23 | Disposition: A | Discharge status: Home / Self Care | Payer: 59 | Source: Ambulatory Visit | Attending: Orthopedic Surgery | Admitting: Orthopedic Surgery

## 2016-11-23 DIAGNOSIS — M25562 Pain in left knee: Secondary | ICD-10-CM | POA: Diagnosis not present

## 2016-11-23 DIAGNOSIS — G8929 Other chronic pain: Secondary | ICD-10-CM

## 2016-12-05 ENCOUNTER — Ambulatory Visit (INDEPENDENT_AMBULATORY_CARE_PROVIDER_SITE_OTHER): Payer: 59 | Admitting: Orthopedic Surgery

## 2016-12-05 ENCOUNTER — Encounter (INDEPENDENT_AMBULATORY_CARE_PROVIDER_SITE_OTHER): Payer: Self-pay | Admitting: Orthopedic Surgery

## 2016-12-05 VITALS — Ht 72.0 in | Wt 352.0 lb

## 2016-12-05 DIAGNOSIS — G8929 Other chronic pain: Secondary | ICD-10-CM | POA: Diagnosis not present

## 2016-12-05 DIAGNOSIS — M25562 Pain in left knee: Secondary | ICD-10-CM | POA: Diagnosis not present

## 2016-12-07 DIAGNOSIS — G4733 Obstructive sleep apnea (adult) (pediatric): Secondary | ICD-10-CM | POA: Diagnosis not present

## 2016-12-08 NOTE — Progress Notes (Signed)
Office Visit Note   Patient: Adam Stephenson           Date of Birth: 1965/10/08           MRN: [id number] Visit Date: 12/05/2016 Requested by: Shirline Frees, MD [address] [place], [place] [address] PCP: Shirline Frees, MD  Subjective: Chief Complaint  Patient presents with  . Left Knee - Follow-up    HPI: Adam Stephenson is a 51 year old patient with left knee pain.  He underwent left knee meniscal root repair several months ago.  Had some good and bad days.  Takes occasional ibuprofen.  Does have a compression sleeve.  MRI scan is reviewed.  Shows likely recurrent tear of the medial meniscus with some extrusion and progressive joint space narrowing.              ROS: All systems reviewed are negative as they relate to the chief complaint within the history of present illness.  Patient denies  fevers or chills.   Assessment & Plan: Visit Diagnoses: No diagnosis found.  Plan: Impression is some discontinuity of the meniscal root repair on the left-hand side.  Mild effusion is present but range of motion and strength is improving.  Plan is for him to lose weight.  He will need knee replacement at sometime in the future.  He needs to get down to about 260 in order for his BMI to be less than 40.  Follow-Up Instructions: Return if symptoms worsen or fail to improve.   Orders:  No orders of the defined types were placed in this encounter.  No orders of the defined types were placed in this encounter.     Procedures: No procedures performed   Clinical Data: No additional findings.  Objective: Vital Signs: Ht 6' (1.829 m)   Wt (!) 352 lb (159.7 kg)   BMI 47.74 kg/m   Physical Exam:   Constitutional: Patient appears well-developed HEENT:  Head: Normocephalic Eyes:EOM are normal Neck: Normal range of motion Cardiovascular: Normal rate Pulmonary/chest: Effort normal Neurologic: Patient is alert Skin: Skin is warm Psychiatric: Patient has normal mood and  affect    Ortho Exam: Orthopedic examination the left knee demonstrates trace effusion and excellent range of motion generally global.  Neck or tenderness but nothing focally on the medial side collateral and cruciate ligaments are stable  Specialty Comments:  No specialty comments available.  Imaging: No results found.   PMFS History: Patient Active Problem List   Diagnosis Date Noted  . Other tear of medial meniscus, current injury, left knee, subsequent encounter 03/02/2016  . Clavicle fracture 07/14/2015   Past Medical History:  Diagnosis Date  . Anxiety   . Complication of anesthesia     2007- Colonoscopy had to be stopped due not breathing, 2016- at [facility]- BP pressure bottomed  . Depression   . Dysrhythmia   . GERD (gastroesophageal reflux disease)   . Headache   . History of kidney stones    x2  passed  . Hyperlipidemia   . Hypertension   . Sleep apnea    USES CPAP  . TBI (traumatic brain injury) ([facility]) 2002  . Tinea versicolor    arm    Family History  Problem Relation Age of Onset  . CAD Father   . CAD Paternal Grandfather     Past Surgical History:  Procedure Laterality Date  . ADENOIDECTOMY    . CARPAL TUNNEL RELEASE Right 07/14/2015   Procedure: CARPAL TUNNEL RELEASE;  Surgeon: Meredith Pel, MD;  Location: [facility];  Service: Orthopedics;  Laterality: Right;  . carpel tunnel left Left   . CHOLECYSTECTOMY  1994  . CLAVICLE HARDWARE REMOVAL Left 03/2009  . CLAVICLE SURGERY Left 07/2008  . COLONOSCOPY     polyp  . ESOPHAGOGASTRODUODENOSCOPY ENDOSCOPY    . FINGER GANGLION CYST EXCISION Right   . HAND SURGERY Right    ring finger - cyst  . HEMORRHOID SURGERY  2016  . KNEE ARTHROSCOPY WITH MENISCAL REPAIR Left 02/01/2016   Procedure: KNEE ARTHROSCOPY WITH MENISCAL ROOT REPAIR VS DEBRIDEMENT;  Surgeon: Meredith Pel, MD;  Location: [facility];  Service: Orthopedics;  Laterality: Left;  . MOUTH SURGERY     wisdom teeth removed  . ORIF CLAVICLE  FRACTURE Right 07/14/2015  . ORIF CLAVICULAR FRACTURE Right 07/14/2015   Procedure: OPEN REDUCTION INTERNAL FIXATION (ORIF) RIGHT CLAVICLE FRACTURE, RIGHT CARPAL TUNNEL RELEASE;  Surgeon: Meredith Pel, MD;  Location: [facility];  Service: Orthopedics;  Laterality: Right;  . SIGMOIDOSCOPY    . TONSILLECTOMY     Social History   Occupational History  . Retired-police    Social History Main Topics  . Smoking status: Never Smoker  . Smokeless tobacco: Never Used  . Alcohol use 4.2 oz/week    3 Glasses of wine, 3 Cans of beer, 1 Shots of liquor per week     Comment: occassional  . Drug use: No  . Sexual activity: Not on file

## 2017-01-11 ENCOUNTER — Ambulatory Visit (INDEPENDENT_AMBULATORY_CARE_PROVIDER_SITE_OTHER): Payer: 59 | Admitting: Orthopedic Surgery

## 2017-01-12 ENCOUNTER — Ambulatory Visit (INDEPENDENT_AMBULATORY_CARE_PROVIDER_SITE_OTHER): Payer: 59

## 2017-01-12 ENCOUNTER — Ambulatory Visit (INDEPENDENT_AMBULATORY_CARE_PROVIDER_SITE_OTHER): Payer: 59 | Admitting: Orthopedic Surgery

## 2017-01-12 ENCOUNTER — Encounter (INDEPENDENT_AMBULATORY_CARE_PROVIDER_SITE_OTHER): Payer: Self-pay | Admitting: Orthopedic Surgery

## 2017-01-12 DIAGNOSIS — S83242D Other tear of medial meniscus, current injury, left knee, subsequent encounter: Secondary | ICD-10-CM

## 2017-01-12 DIAGNOSIS — M79671 Pain in right foot: Secondary | ICD-10-CM | POA: Diagnosis not present

## 2017-01-12 NOTE — Progress Notes (Signed)
Office Visit Note   Patient: Adam Stephenson           Date of Birth: 1965/06/01           MRN: [id number] Visit Date: 01/12/2017 Requested by: Shirline Frees, MD [address], [place] [address] PCP: Shirline Frees, MD  Subjective: No chief complaint on file.   HPI: Adam Stephenson is a 51 year old patient with right heel pain of 2 weeks duration.  Has difficulty walking in the morning.  Reports pain on the medial aspect of the calcaneus.  He states that he also is having some left knee pain.  Had a meniscal root repair done several months ago.  No discrete mechanical symptoms in the left knee.  He is not taking medication for the problem other than occasional over-the-counter medication              ROS: All systems reviewed are negative as they relate to the chief complaint within the history of present illness.  Patient denies  fevers or chills.   Assessment & Plan: Visit Diagnoses: No diagnosis found.  Plan: Impression is right heel pain which looks like plantar fasciitis.  Radiographs do not show definite stress reaction or stress fracture.  Plan is heel cord stretching Viscoheel plus topical anti-inflammatory.  In regards to the left knee I do not think there is really anything to be done currently.  He does have a mild effusion but less than what he has had in the past.  Weight loss again encouraged that skin to be the biggest factor that would make a big difference in both of these issues  Follow-Up Instructions: No Follow-up on file.   Orders:  No orders of the defined types were placed in this encounter.  No orders of the defined types were placed in this encounter.     Procedures: No procedures performed   Clinical Data: No additional findings.  Objective: Vital Signs: There were no vitals taken for this visit.  Physical Exam:   Constitutional: Patient appears well-developed HEENT:  Head: Normocephalic Eyes:EOM are normal Neck: Normal  range of motion Cardiovascular: Normal rate Pulmonary/chest: Effort normal Neurologic: Patient is alert Skin: Skin is warm Psychiatric: Patient has normal mood and affect    Ortho Exam: Orthopedic exam demonstrates mild tenderness on the plantar aspect of the right heel but more tenderness on the medial aspect of the heel.  Achilles tendon is nontender.  Pedal pulses palpable.  Patient has palpable intact nontender anterior to posterior to peroneal and Achilles tendons.  Tibiotalar subtalar transverse tarsal range of motion intact bilaterally.  Left knee has mild effusion but good range of motion improving quad strength.  Specialty Comments:  No specialty comments available.  Imaging: No results found.   PMFS History: Patient Active Problem List   Diagnosis Date Noted  . Other tear of medial meniscus, current injury, left knee, subsequent encounter 03/02/2016  . Clavicle fracture 07/14/2015   Past Medical History:  Diagnosis Date  . Anxiety   . Complication of anesthesia     2007- Colonoscopy had to be stopped due not breathing, 2016- at [facility]- BP pressure bottomed  . Depression   . Dysrhythmia   . GERD (gastroesophageal reflux disease)   . Headache   . History of kidney stones    x2  passed  . Hyperlipidemia   . Hypertension   . Sleep apnea    USES CPAP  . TBI (traumatic brain injury) ([facility]) 2002  .  Tinea versicolor    arm    Family History  Problem Relation Age of Onset  . CAD Father   . CAD Paternal Grandfather     Past Surgical History:  Procedure Laterality Date  . ADENOIDECTOMY    . CARPAL TUNNEL RELEASE Right 07/14/2015   Procedure: CARPAL TUNNEL RELEASE;  Surgeon: Meredith Pel, MD;  Location: [facility];  Service: Orthopedics;  Laterality: Right;  . carpel tunnel left Left   . CHOLECYSTECTOMY  1994  . CLAVICLE HARDWARE REMOVAL Left 03/2009  . CLAVICLE SURGERY Left 07/2008  . COLONOSCOPY     polyp  . ESOPHAGOGASTRODUODENOSCOPY ENDOSCOPY    . FINGER  GANGLION CYST EXCISION Right   . HAND SURGERY Right    ring finger - cyst  . HEMORRHOID SURGERY  2016  . KNEE ARTHROSCOPY WITH MENISCAL REPAIR Left 02/01/2016   Procedure: KNEE ARTHROSCOPY WITH MENISCAL ROOT REPAIR VS DEBRIDEMENT;  Surgeon: Meredith Pel, MD;  Location: [facility];  Service: Orthopedics;  Laterality: Left;  . MOUTH SURGERY     wisdom teeth removed  . ORIF CLAVICLE FRACTURE Right 07/14/2015  . ORIF CLAVICULAR FRACTURE Right 07/14/2015   Procedure: OPEN REDUCTION INTERNAL FIXATION (ORIF) RIGHT CLAVICLE FRACTURE, RIGHT CARPAL TUNNEL RELEASE;  Surgeon: Meredith Pel, MD;  Location: [facility];  Service: Orthopedics;  Laterality: Right;  . SIGMOIDOSCOPY    . TONSILLECTOMY     Social History   Occupational History  . Retired-police    Social History Main Topics  . Smoking status: Never Smoker  . Smokeless tobacco: Never Used  . Alcohol use 4.2 oz/week    3 Glasses of wine, 3 Cans of beer, 1 Shots of liquor per week     Comment: occassional  . Drug use: No  . Sexual activity: Not on file

## 2017-01-13 MED ORDER — DICLOFENAC SODIUM 2 % TD SOLN: 2.0000 | Freq: Two times a day (BID) | TRANSDERMAL | 1 refills | Status: DC

## 2017-01-13 NOTE — Addendum Note (Signed)
Addended by: Brand Males E on: 01/13/2017 08:39 AM   Modules accepted: Orders

## 2017-02-08 DIAGNOSIS — R3915 Urgency of urination: Secondary | ICD-10-CM | POA: Diagnosis not present

## 2017-02-08 DIAGNOSIS — R310 Gross hematuria: Secondary | ICD-10-CM | POA: Diagnosis not present

## 2017-02-13 ENCOUNTER — Ambulatory Visit: Payer: 59 | Admitting: Podiatry

## 2017-02-13 ENCOUNTER — Encounter: Payer: Self-pay | Admitting: Podiatry

## 2017-02-13 ENCOUNTER — Ambulatory Visit (INDEPENDENT_AMBULATORY_CARE_PROVIDER_SITE_OTHER): Payer: 59

## 2017-02-13 DIAGNOSIS — M722 Plantar fascial fibromatosis: Secondary | ICD-10-CM | POA: Diagnosis not present

## 2017-02-13 DIAGNOSIS — M79674 Pain in right toe(s): Secondary | ICD-10-CM | POA: Diagnosis not present

## 2017-02-13 DIAGNOSIS — M79675 Pain in left toe(s): Secondary | ICD-10-CM | POA: Diagnosis not present

## 2017-02-13 DIAGNOSIS — B351 Tinea unguium: Secondary | ICD-10-CM | POA: Diagnosis not present

## 2017-02-13 NOTE — Patient Instructions (Signed)

## 2017-02-15 NOTE — Progress Notes (Signed)
Subjective: 51 y.o. returns the office today for painful, elongated, thickened toenails which they cannot trim themself. Denies any redness or drainage around the nails. He has continued with the topical anti-fungal and he feels he can tell a difference. He also has new concerns of right heel pain which is been ongoing for about 2 months. He's been doing some stretching exercises and icing but he still gets pain in the bottom of his heel warts been ongoing for 2 months. Describes as throbbing sensation. He did go to insert at the [employer] store but due to cost she did not get them. Denies any numbness or tingling or sharp pains. No pain at nighttime.  Denies any acute changes since last appointment and no new complaints today. Denies any systemic complaints such as fevers, chills, nausea, vomiting.   Objective: AAO 3, NAD DP/PT pulses palpable, CRT less than 3 seconds Nails hypertrophic, dystrophic, elongated, brittle, discolored 2. There is tenderness overlying the hallux nails bilaterally. There is no surrounding erythema or drainage along the nail sites. No open lesions or pre-ulcerative lesions are identified. Tenderness to palpation along the plantar medial tubercle of the calcaneus at the insertion of plantar fascia on the right foot. There is no pain along the course of the plantar fascia within the arch of the foot. Plantar fascia appears to be intact. There is no pain with lateral compression of the calcaneus or pain with vibratory sensation. There is no pain along the course or insertion of the achilles tendon. No other areas of tenderness to bilateral lower extremities. Negative tinel sign No other areas of tenderness bilateral lower extremities. No overlying edema, erythema, increased warmth. No pain with calf compression, swelling, warmth, erythema.  Assessment: Patient presents with symptomatic onychomycosisRight heel pain component fasciitis;   Plan: 1. Symptomatic onychomycosis   -Treatment options including alternatives, risks, complications were discussed -Nails sharply debrided 2 without complication/bleeding.  -Continue with topical anti-fungal.  -Discussed daily foot inspection. If there are any changes, to call the office immediately.   2. Plantar fasciitis, right -X-rays were obtained and reviewed. There is no evidence of acute fracture identified. -Heel injections performed the right side after full consent. See procedure note below -Stretching, icing exercises daily. -Plantar fascial brace dispensed. -Anti-inflammatories as needed. He has Pennsaid  Procedure: Injection Tendon/Ligament Discussed alternatives, risks, complications and verbal consent was obtained.  Location: Right plantar fascia at the glabrous junction; medial approach. Skin Prep: Alcohol. Injectate: 1 cc 0.5% marcaine plain, 1 cc 0.5% Marcaine plain and, 1 cc kenalog 10. Disposition: Patient tolerated procedure well. Injection site dressed with a band-aid.  Post-injection care was discussed and return precautions discussed.   -Follow-up in 3 weeksor sooner if any problems are to arise. In the meantime, encouraged to call the office with any questions, concerns, changes symptoms.  Celesta Gentile, DPM

## 2017-03-01 DIAGNOSIS — R310 Gross hematuria: Secondary | ICD-10-CM | POA: Diagnosis not present

## 2017-03-01 DIAGNOSIS — N200 Calculus of kidney: Secondary | ICD-10-CM | POA: Diagnosis not present

## 2017-03-13 DIAGNOSIS — R3915 Urgency of urination: Secondary | ICD-10-CM | POA: Diagnosis not present

## 2017-03-13 DIAGNOSIS — R310 Gross hematuria: Secondary | ICD-10-CM | POA: Diagnosis not present

## 2017-03-16 ENCOUNTER — Ambulatory Visit: Payer: 59 | Admitting: Podiatry

## 2017-03-30 ENCOUNTER — Ambulatory Visit: Payer: 59 | Admitting: Podiatry

## 2017-03-30 DIAGNOSIS — I10 Essential (primary) hypertension: Secondary | ICD-10-CM | POA: Diagnosis not present

## 2017-03-30 DIAGNOSIS — M722 Plantar fascial fibromatosis: Secondary | ICD-10-CM

## 2017-03-30 DIAGNOSIS — E291 Testicular hypofunction: Secondary | ICD-10-CM | POA: Diagnosis not present

## 2017-03-31 NOTE — Progress Notes (Signed)
Subjective: Adam Stephenson presents the office today for follow-up evaluation of right heel pain.  He states that he is doing better but he still has some intermittent discomfort.  The injection did help and he also did purchase over-the-counter inserts which is been helping.  He has been trying to stretch  as much as possible but he has not been icing.  Overall he is doing better but still having some mild symptoms.  No recent changes otherwise.Denies any systemic complaints such as fevers, chills, nausea, vomiting. No acute changes since last appointment, and no other complaints at this time.   Objective: AAO x3, NAD DP/PT pulses palpable bilaterally, CRT less than 3 seconds There is improved but still some tenderness to palpation along the plantar medial tubercle of the calcaneus at the insertion of plantar fascia on the right foot. There is no pain along the course of the plantar fascia within the arch of the foot. Plantar fascia appears to be intact. There is no pain with lateral compression of the calcaneus or pain with vibratory sensation. There is no pain along the course or insertion of the achilles tendon. No other areas of tenderness to bilateral lower extremities.  No open lesions or pre-ulcerative lesions.  No pain with calf compression, swelling, warmth, erythema  Assessment: Right heel pain, plantar fasciitis  Plan: -All treatment options discussed with the patient including all alternatives, risks, complications.  -Discussed steroid injection.  Procedure: Injection Tendon/Ligament Discussed alternatives, risks, complications and verbal consent was obtained.  Location: Right plantar fascia at the glabrous junction; medial approach. Skin Prep: Alcohol. Injectate: 1 cc 0.5% marcaine plain, 1 cc 0.5% Marcaine plain and, 1 cc kenalog 10. Disposition: Patient tolerated procedure well. Injection site dressed with a band-aid.  Post-injection care was discussed and return precautions  discussed.  -Continue inserts and supportive shoes -Stretching and icing daily -RTC 6 weeks for follow-up of plantar fasciitis and nail fungus or sooner if needed.  -Patient encouraged to call the office with any questions, concerns, change in symptoms.   Trula Slade DPM

## 2017-04-03 ENCOUNTER — Telehealth (INDEPENDENT_AMBULATORY_CARE_PROVIDER_SITE_OTHER): Payer: Self-pay | Admitting: Orthopedic Surgery

## 2017-04-03 NOTE — Telephone Encounter (Signed)
Patients girlfriend is returning your call from Friday to get an appointment scheduled this week with Dr. Marlou Sa before they leave for vacation if you could return her call. # [phone]

## 2017-04-03 NOTE — Telephone Encounter (Signed)
IC her back. No answer. LM advising was calling her back to schedule appt.

## 2017-04-03 NOTE — Telephone Encounter (Signed)
Adam Stephenson the notes unfortunately not sure where you werw going to place patient.  Please call her back

## 2017-04-05 ENCOUNTER — Ambulatory Visit (INDEPENDENT_AMBULATORY_CARE_PROVIDER_SITE_OTHER): Payer: 59 | Admitting: Orthopedic Surgery

## 2017-04-05 ENCOUNTER — Ambulatory Visit (INDEPENDENT_AMBULATORY_CARE_PROVIDER_SITE_OTHER): Payer: 59

## 2017-04-05 ENCOUNTER — Encounter (INDEPENDENT_AMBULATORY_CARE_PROVIDER_SITE_OTHER): Payer: Self-pay | Admitting: Orthopedic Surgery

## 2017-04-05 DIAGNOSIS — M25512 Pain in left shoulder: Secondary | ICD-10-CM | POA: Diagnosis not present

## 2017-04-05 DIAGNOSIS — G8929 Other chronic pain: Secondary | ICD-10-CM

## 2017-04-05 DIAGNOSIS — S83242D Other tear of medial meniscus, current injury, left knee, subsequent encounter: Secondary | ICD-10-CM | POA: Diagnosis not present

## 2017-04-05 DIAGNOSIS — M25511 Pain in right shoulder: Secondary | ICD-10-CM | POA: Diagnosis not present

## 2017-04-05 DIAGNOSIS — E782 Mixed hyperlipidemia: Secondary | ICD-10-CM | POA: Diagnosis not present

## 2017-04-05 DIAGNOSIS — I10 Essential (primary) hypertension: Secondary | ICD-10-CM | POA: Diagnosis not present

## 2017-04-06 ENCOUNTER — Encounter (INDEPENDENT_AMBULATORY_CARE_PROVIDER_SITE_OTHER): Payer: Self-pay | Admitting: Orthopedic Surgery

## 2017-04-06 DIAGNOSIS — S83242D Other tear of medial meniscus, current injury, left knee, subsequent encounter: Secondary | ICD-10-CM

## 2017-04-06 DIAGNOSIS — M25511 Pain in right shoulder: Secondary | ICD-10-CM | POA: Diagnosis not present

## 2017-04-06 DIAGNOSIS — M25512 Pain in left shoulder: Secondary | ICD-10-CM | POA: Diagnosis not present

## 2017-04-06 MED ORDER — LIDOCAINE HCL 1 % IJ SOLN
5.0000 mL | INTRAMUSCULAR | Status: AC | PRN
  Administered 2017-04-06: 5 mL

## 2017-04-06 MED ORDER — BUPIVACAINE HCL 0.25 % IJ SOLN
4.0000 mL | INTRAMUSCULAR | Status: AC | PRN
  Administered 2017-04-06: 4 mL via INTRA_ARTICULAR

## 2017-04-06 MED ORDER — METHYLPREDNISOLONE ACETATE 40 MG/ML IJ SUSP
40.0000 mg | INTRAMUSCULAR | Status: AC | PRN
  Administered 2017-04-06: 40 mg via INTRA_ARTICULAR

## 2017-04-06 NOTE — Progress Notes (Signed)
Office Visit Note   Patient: Adam Stephenson           Date of Birth: 1965-12-17           MRN: [id number] Visit Date: 04/05/2017 Requested by: Shirline Frees, MD [address] [place], [place] [address] PCP: Shirline Frees, MD  Subjective: Chief Complaint  Patient presents with  . Right Shoulder - Pain  . Left Shoulder - Pain  . Left Knee - Pain    HPI: [name] is a 52 year old patient with left knee pain and bilateral shoulder pain.  He had left knee surgery in 2017 which was a meniscal root repair.  He has been walking and having some recurrent pain symptoms in the left knee.  He also reports bilateral shoulder pain which will wake him from sleep at night.  He has had bilateral clavicle fractures treated with plate fixation on the right and pin fixation and removal on the left.  Denies any neck pain or radicular symptoms.  Since I have last seen him he has lost 6 pounds.              ROS: All systems reviewed are negative as they relate to the chief complaint within the history of present illness.  Patient denies  fevers or chills.   Assessment & Plan: Visit Diagnoses:  1. Chronic right shoulder pain   2. Chronic left shoulder pain   3. Other tear of medial meniscus, current injury, left knee, subsequent encounter     Plan: Impression is right shoulder pain which looks like moderate glenohumeral arthritis as well as left shoulder pain which looks like milder glenohumeral arthritis.  Rotator cuff strength is good but he does have slight limitation range of motion on the right consistent with moderate arthritis.  Does not look like a frozen shoulder.  Rotator cuff strength is good on both sides.  The left knee is aspirated and injected today.  He is going to develop arthritis but is not a candidate for knee replacement surgery until his BMI is lowered.  Follow-up with me as needed.  Follow-Up Instructions: Return if symptoms worsen or fail to improve.   Orders:    Orders Placed This Encounter  Procedures  . XR Shoulder 1V Right  . XR Shoulder Left   No orders of the defined types were placed in this encounter.     Procedures: Large Joint Inj: L knee on 04/06/2017 12:30 PM Indications: diagnostic evaluation, joint swelling and pain Details: 18 G 1.5 in needle, superolateral approach  Arthrogram: No  Medications: 5 mL lidocaine 1 %; 40 mg methylPREDNISolone acetate 40 MG/ML; 4 mL bupivacaine 0.25 % Aspirate: 20 mL yellow Outcome: tolerated well, no immediate complications Procedure, treatment alternatives, risks and benefits explained, specific risks discussed. Consent was given by the patient. Immediately prior to procedure a time out was called to verify the correct patient, procedure, equipment, support staff and site/side marked as required. Patient was prepped and draped in the usual sterile fashion.       Clinical Data: No additional findings.  Objective: Vital Signs: There were no vitals taken for this visit.  Physical Exam:   Constitutional: Patient appears well-developed HEENT:  Head: Normocephalic Eyes:EOM are normal Neck: Normal range of motion Cardiovascular: Normal rate Pulmonary/chest: Effort normal Neurologic: Patient is alert Skin: Skin is warm Psychiatric: Patient has normal mood and affect    Ortho Exam: Orthopedic exam demonstrates mild effusion in the left knee but with good  range of motion good quad strength fairly global periretinacular tenderness not really localizing to the medial or lateral side.  Quad strength is good on the left.  Both shoulders are examined.  Patient has good rotator cuff strength.  This is to infraspinatus supraspinatus and subscap muscle testing.  Well-healed surgical incision from clavicle fixation on the right and left.  No coarse grinding or crepitus with passive or active range of motion of the shoulder but he does have about 10 degrees less range of motion on the right shoulder  compared to the left.  Negative O'Brien's testing bilaterally and negative apprehension relocation testing bilaterally.Marland Kitchen  Specialty Comments:  No specialty comments available.  Imaging: Xr Shoulder 1v Right  Result Date: 04/06/2017 AP right shoulder reviewed.  Clavicle plate fixation in good position and alignment with no evidence of hardware lucency or loosening.  There is an inferior humeral head spur consistent with glenohumeral arthritis.  Acromiohumeral distance maintained.  No fracture or dislocation present.  Visualized lung fields clear.  Xr Shoulder Left  Result Date: 04/06/2017 AP outlet axillary left shoulder reviewed.  There is smaller inferior humeral head articular osteophyte consistent with mild glenohumeral arthritis.  Very mild post fracture deformity of the clavicle noted with maintenance of length and alignment.  Acromiohumeral distance maintained.  No dislocation or fracture present.    PMFS History: Patient Active Problem List   Diagnosis Date Noted  . Pain of right heel 01/12/2017  . Other tear of medial meniscus, current injury, left knee, subsequent encounter 03/02/2016  . Clavicle fracture 07/14/2015   Past Medical History:  Diagnosis Date  . Anxiety   . Complication of anesthesia     2007- Colonoscopy had to be stopped due not breathing, 2016- at [facility]- BP pressure bottomed  . Depression   . Dysrhythmia   . GERD (gastroesophageal reflux disease)   . Headache   . History of kidney stones    x2  passed  . Hyperlipidemia   . Hypertension   . Sleep apnea    USES CPAP  . TBI (traumatic brain injury) ([facility]) 2002  . Tinea versicolor    arm    Family History  Problem Relation Age of Onset  . CAD Father   . CAD Paternal Grandfather     Past Surgical History:  Procedure Laterality Date  . ADENOIDECTOMY    . CARPAL TUNNEL RELEASE Right 07/14/2015   Procedure: CARPAL TUNNEL RELEASE;  Surgeon: Meredith Pel, MD;  Location: [facility];  Service:  Orthopedics;  Laterality: Right;  . carpel tunnel left Left   . CHOLECYSTECTOMY  1994  . CLAVICLE HARDWARE REMOVAL Left 03/2009  . CLAVICLE SURGERY Left 07/2008  . COLONOSCOPY     polyp  . ESOPHAGOGASTRODUODENOSCOPY ENDOSCOPY    . FINGER GANGLION CYST EXCISION Right   . HAND SURGERY Right    ring finger - cyst  . HEMORRHOID SURGERY  2016  . KNEE ARTHROSCOPY WITH MENISCAL REPAIR Left 02/01/2016   Procedure: KNEE ARTHROSCOPY WITH MENISCAL ROOT REPAIR VS DEBRIDEMENT;  Surgeon: Meredith Pel, MD;  Location: [facility];  Service: Orthopedics;  Laterality: Left;  . MOUTH SURGERY     wisdom teeth removed  . ORIF CLAVICLE FRACTURE Right 07/14/2015  . ORIF CLAVICULAR FRACTURE Right 07/14/2015   Procedure: OPEN REDUCTION INTERNAL FIXATION (ORIF) RIGHT CLAVICLE FRACTURE, RIGHT CARPAL TUNNEL RELEASE;  Surgeon: Meredith Pel, MD;  Location: [facility];  Service: Orthopedics;  Laterality: Right;  . SIGMOIDOSCOPY    . TONSILLECTOMY  Social History   Occupational History  . Occupation: Retired-police  Tobacco Use  . Smoking status: Never Smoker  . Smokeless tobacco: Never Used  Substance and Sexual Activity  . Alcohol use: Yes    Alcohol/week: 4.2 oz    Types: 3 Glasses of wine, 3 Cans of beer, 1 Shots of liquor per week    Comment: occassional  . Drug use: No  . Sexual activity: Not on file

## 2017-04-17 ENCOUNTER — Ambulatory Visit (INDEPENDENT_AMBULATORY_CARE_PROVIDER_SITE_OTHER): Payer: 59 | Admitting: Orthopedic Surgery

## 2017-04-24 DIAGNOSIS — G4733 Obstructive sleep apnea (adult) (pediatric): Secondary | ICD-10-CM | POA: Diagnosis not present

## 2017-05-08 DIAGNOSIS — B359 Dermatophytosis, unspecified: Secondary | ICD-10-CM | POA: Diagnosis not present

## 2017-05-08 DIAGNOSIS — L821 Other seborrheic keratosis: Secondary | ICD-10-CM | POA: Diagnosis not present

## 2017-05-08 DIAGNOSIS — Z86018 Personal history of other benign neoplasm: Secondary | ICD-10-CM | POA: Diagnosis not present

## 2017-05-11 ENCOUNTER — Encounter: Payer: Self-pay | Admitting: Podiatry

## 2017-05-11 ENCOUNTER — Ambulatory Visit: Payer: 59 | Admitting: Podiatry

## 2017-05-11 DIAGNOSIS — I10 Essential (primary) hypertension: Secondary | ICD-10-CM | POA: Diagnosis not present

## 2017-05-11 DIAGNOSIS — M722 Plantar fascial fibromatosis: Secondary | ICD-10-CM | POA: Diagnosis not present

## 2017-05-11 DIAGNOSIS — M79674 Pain in right toe(s): Secondary | ICD-10-CM | POA: Diagnosis not present

## 2017-05-11 DIAGNOSIS — M79675 Pain in left toe(s): Secondary | ICD-10-CM | POA: Diagnosis not present

## 2017-05-11 DIAGNOSIS — E782 Mixed hyperlipidemia: Secondary | ICD-10-CM | POA: Diagnosis not present

## 2017-05-11 DIAGNOSIS — E8881 Metabolic syndrome: Secondary | ICD-10-CM | POA: Diagnosis not present

## 2017-05-11 DIAGNOSIS — B351 Tinea unguium: Secondary | ICD-10-CM | POA: Diagnosis not present

## 2017-05-12 DIAGNOSIS — K219 Gastro-esophageal reflux disease without esophagitis: Secondary | ICD-10-CM | POA: Diagnosis not present

## 2017-05-12 DIAGNOSIS — I10 Essential (primary) hypertension: Secondary | ICD-10-CM | POA: Diagnosis not present

## 2017-05-12 DIAGNOSIS — E7800 Pure hypercholesterolemia, unspecified: Secondary | ICD-10-CM | POA: Diagnosis not present

## 2017-05-14 NOTE — Progress Notes (Signed)
Subjective: 52 year old male presents the office today for follow-up evaluation of right heel pain, plantar fasciitis.  He states he is doing much better.  He denies any increase in pain or swelling or redness and overall he is doing much better.  He has been stretching but has not been icing.  He has had no pain recently.  He also presents as his nails are thick and elongated cannot trim himself in a couple trimmed.  Denies any redness or drainage from the toenail sites.  He has no other concerns today. Denies any systemic complaints such as fevers, chills, nausea, vomiting. No acute changes since last appointment, and no other complaints at this time.   Objective: AAO x3, NAD DP/PT pulses palpable bilaterally, CRT less than 3 seconds At this time there is no significant tenderness palpation on the plantar fascial the arch of the foot but there is minimal discomfort on the plantar medial tubercle on the insertion.  No pain with lateral compression of the calcaneus.  No overlying edema, erythema, increased warmth.  No pain along the Achilles tendon. Nails are hypertrophic, dystrophic, brittle, discolored, elongated 10. No surrounding redness or drainage. Tenderness nails 1-5 bilaterally. No open lesions or pre-ulcerative lesions are identified today. No open lesions or pre-ulcerative lesions.  No pain with calf compression, swelling, warmth, erythema  Assessment: Resolving right heel pain, plantar fasciitis with symptom medic onychomycosis  Plan: -All treatment options discussed with the patient including all alternatives, risks, complications.  -Nails debrided x10 without any complications or bleeding. -Regards to the plantar fasciitis will be continue with stretching, icing exercises daily as well as supportive shoes.  He did inserts which did help.  There is any reoccurrence of pain follow-up before his next appointment.  Otherwise I will see him back in 3 months for routine care. -Patient  encouraged to call the office with any questions, concerns, change in symptoms.   Adam Stephenson DPM

## 2017-05-19 DIAGNOSIS — I10 Essential (primary) hypertension: Secondary | ICD-10-CM | POA: Diagnosis not present

## 2017-05-19 DIAGNOSIS — E782 Mixed hyperlipidemia: Secondary | ICD-10-CM | POA: Diagnosis not present

## 2017-05-19 DIAGNOSIS — E8881 Metabolic syndrome: Secondary | ICD-10-CM | POA: Diagnosis not present

## 2017-05-24 DIAGNOSIS — E782 Mixed hyperlipidemia: Secondary | ICD-10-CM | POA: Diagnosis not present

## 2017-05-24 DIAGNOSIS — I10 Essential (primary) hypertension: Secondary | ICD-10-CM | POA: Diagnosis not present

## 2017-05-31 DIAGNOSIS — I10 Essential (primary) hypertension: Secondary | ICD-10-CM | POA: Diagnosis not present

## 2017-06-07 DIAGNOSIS — I10 Essential (primary) hypertension: Secondary | ICD-10-CM | POA: Diagnosis not present

## 2017-06-07 DIAGNOSIS — E8881 Metabolic syndrome: Secondary | ICD-10-CM | POA: Diagnosis not present

## 2017-06-12 DIAGNOSIS — R3915 Urgency of urination: Secondary | ICD-10-CM | POA: Diagnosis not present

## 2017-06-12 DIAGNOSIS — N281 Cyst of kidney, acquired: Secondary | ICD-10-CM | POA: Diagnosis not present

## 2017-06-12 DIAGNOSIS — N200 Calculus of kidney: Secondary | ICD-10-CM | POA: Diagnosis not present

## 2017-06-14 DIAGNOSIS — E8881 Metabolic syndrome: Secondary | ICD-10-CM | POA: Diagnosis not present

## 2017-06-14 DIAGNOSIS — I10 Essential (primary) hypertension: Secondary | ICD-10-CM | POA: Diagnosis not present

## 2017-06-14 DIAGNOSIS — E782 Mixed hyperlipidemia: Secondary | ICD-10-CM | POA: Diagnosis not present

## 2017-06-27 DIAGNOSIS — I10 Essential (primary) hypertension: Secondary | ICD-10-CM | POA: Diagnosis not present

## 2017-06-27 DIAGNOSIS — E782 Mixed hyperlipidemia: Secondary | ICD-10-CM | POA: Diagnosis not present

## 2017-07-26 DIAGNOSIS — S50861A Insect bite (nonvenomous) of right forearm, initial encounter: Secondary | ICD-10-CM | POA: Diagnosis not present

## 2017-07-27 DIAGNOSIS — I10 Essential (primary) hypertension: Secondary | ICD-10-CM | POA: Diagnosis not present

## 2017-08-08 ENCOUNTER — Encounter: Payer: Self-pay | Admitting: Podiatry

## 2017-08-08 ENCOUNTER — Ambulatory Visit: Payer: 59 | Admitting: Podiatry

## 2017-08-08 ENCOUNTER — Other Ambulatory Visit: Payer: Self-pay

## 2017-08-08 DIAGNOSIS — M722 Plantar fascial fibromatosis: Secondary | ICD-10-CM | POA: Diagnosis not present

## 2017-08-08 DIAGNOSIS — B351 Tinea unguium: Secondary | ICD-10-CM

## 2017-08-08 DIAGNOSIS — M79674 Pain in right toe(s): Secondary | ICD-10-CM

## 2017-08-08 DIAGNOSIS — M79675 Pain in left toe(s): Secondary | ICD-10-CM | POA: Diagnosis not present

## 2017-08-09 NOTE — Progress Notes (Signed)
Subjective: 52 y.o. returns the office today for painful, elongated, thickened toenails which they cannot trim themself. Denies any redness or drainage around the nails. He has continued with the topical anti-fungal and he feels he can tell a difference.  He says the heel has been doing well overall.  Some little bit of discomfort today but overall states it is manageable.  He states as long as he wears good shoes with good arch support he does well.  Denies any acute changes since last appointment and no new complaints today. Denies any systemic complaints such as fevers, chills, nausea, vomiting.   Objective: AAO 3, NAD DP/PT pulses palpable, CRT less than 3 seconds Nails hypertrophic, dystrophic, elongated, brittle, discolored 2. There is tenderness overlying the hallux nails bilaterally. There is no surrounding erythema or drainage along the nail sites. No open lesions or pre-ulcerative lesions are identified. There is minimal tenderness to palpation along the plantar medial tubercle of the calcaneus at the insertion of plantar fascia on the right foot. There is no pain along the course of the plantar fascia within the arch of the foot. Plantar fascia appears to be intact. There is no pain with lateral compression of the calcaneus or pain with vibratory sensation. There is no pain along the course or insertion of the achilles tendon. No other areas of tenderness to bilateral lower extremities. Negative tinel sign No other areas of tenderness bilateral lower extremities. No overlying edema, erythema, increased warmth. No pain with calf compression, swelling, warmth, erythema.  Assessment: Patient presents with symptomatic onychomycosisRight heel pain component fasciitis;   Plan: 1. Symptomatic onychomycosis  -Treatment options including alternatives, risks, complications were discussed -Nails sharply debrided 2 without complication/bleeding.  As a courtesy I debrided the other toenails today  without any complications of bleeding. -Continue with topical anti-fungal.  -Discussed daily foot inspection. If there are any changes, to call the office immediately.   2. Plantar fasciitis, right -Continue supportive shoes discussed stretching, icing on a regular basis.  Declines steroid injection today.  Celesta Gentile, DPM

## 2017-08-11 DIAGNOSIS — I10 Essential (primary) hypertension: Secondary | ICD-10-CM | POA: Diagnosis not present

## 2017-08-29 ENCOUNTER — Encounter: Payer: Self-pay | Admitting: Skilled Nursing Facility1

## 2017-08-29 ENCOUNTER — Encounter: Payer: 59 | Attending: Family Medicine | Admitting: Skilled Nursing Facility1

## 2017-08-29 DIAGNOSIS — Z713 Dietary counseling and surveillance: Secondary | ICD-10-CM | POA: Diagnosis not present

## 2017-08-29 DIAGNOSIS — E669 Obesity, unspecified: Secondary | ICD-10-CM

## 2017-08-29 DIAGNOSIS — R7303 Prediabetes: Secondary | ICD-10-CM | POA: Diagnosis not present

## 2017-08-29 DIAGNOSIS — K219 Gastro-esophageal reflux disease without esophagitis: Secondary | ICD-10-CM | POA: Insufficient documentation
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
## 2017-09-14 ENCOUNTER — Encounter: Payer: Self-pay | Admitting: Skilled Nursing Facility1

## 2017-09-14 ENCOUNTER — Encounter: Payer: 59 | Admitting: Skilled Nursing Facility1

## 2017-09-14 DIAGNOSIS — Z713 Dietary counseling and surveillance: Secondary | ICD-10-CM | POA: Diagnosis not present

## 2017-09-14 DIAGNOSIS — E669 Obesity, unspecified: Secondary | ICD-10-CM
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
## 2017-10-05 ENCOUNTER — Ambulatory Visit: Payer: 59 | Admitting: Skilled Nursing Facility1

## 2017-11-07 ENCOUNTER — Ambulatory Visit: Payer: 59 | Admitting: Podiatry

## 2017-11-13 DIAGNOSIS — E7800 Pure hypercholesterolemia, unspecified: Secondary | ICD-10-CM | POA: Diagnosis not present

## 2017-11-13 DIAGNOSIS — R7303 Prediabetes: Secondary | ICD-10-CM | POA: Diagnosis not present

## 2017-11-13 DIAGNOSIS — I10 Essential (primary) hypertension: Secondary | ICD-10-CM | POA: Diagnosis not present

## 2017-12-06 DIAGNOSIS — G4733 Obstructive sleep apnea (adult) (pediatric): Secondary | ICD-10-CM | POA: Diagnosis not present

## 2018-03-22 DIAGNOSIS — I10 Essential (primary) hypertension: Secondary | ICD-10-CM | POA: Diagnosis not present

## 2018-03-22 DIAGNOSIS — R7303 Prediabetes: Secondary | ICD-10-CM | POA: Diagnosis not present

## 2018-03-22 DIAGNOSIS — Z23 Encounter for immunization: Secondary | ICD-10-CM | POA: Diagnosis not present

## 2018-03-22 DIAGNOSIS — E7800 Pure hypercholesterolemia, unspecified: Secondary | ICD-10-CM | POA: Diagnosis not present

## 2018-04-13 DIAGNOSIS — R7303 Prediabetes: Secondary | ICD-10-CM | POA: Diagnosis not present

## 2018-04-13 DIAGNOSIS — Z713 Dietary counseling and surveillance: Secondary | ICD-10-CM | POA: Diagnosis not present

## 2018-04-13 DIAGNOSIS — E7800 Pure hypercholesterolemia, unspecified: Secondary | ICD-10-CM | POA: Diagnosis not present
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
## 2018-05-23 DIAGNOSIS — D2372 Other benign neoplasm of skin of left lower limb, including hip: Secondary | ICD-10-CM | POA: Diagnosis not present

## 2018-05-23 DIAGNOSIS — D2230 Melanocytic nevi of unspecified part of face: Secondary | ICD-10-CM | POA: Diagnosis not present

## 2018-05-23 DIAGNOSIS — Z86018 Personal history of other benign neoplasm: Secondary | ICD-10-CM | POA: Diagnosis not present

## 2018-11-02 ENCOUNTER — Ambulatory Visit: Payer: Self-pay

## 2018-11-02 ENCOUNTER — Encounter: Payer: Self-pay | Admitting: Orthopedic Surgery

## 2018-11-02 ENCOUNTER — Ambulatory Visit (INDEPENDENT_AMBULATORY_CARE_PROVIDER_SITE_OTHER): Payer: 59 | Admitting: Orthopedic Surgery

## 2018-11-02 VITALS — Ht 72.0 in | Wt 361.0 lb

## 2018-11-02 DIAGNOSIS — M25562 Pain in left knee: Secondary | ICD-10-CM

## 2018-11-02 DIAGNOSIS — Z6841 Body Mass Index (BMI) 40.0 and over, adult: Secondary | ICD-10-CM | POA: Diagnosis not present

## 2018-11-02 NOTE — Progress Notes (Signed)
Office Visit Note   Patient: Adam Stephenson           Date of Birth: 03/14/1966           MRN: [id number] Visit Date: 11/02/2018 Requested by: Shirline Frees, MD [place],   [id number] PCP: Shirline Frees, MD  Subjective: Chief Complaint  Patient presents with  . Left Knee - Pain    HPI: Adam Stephenson is a patient with severe left knee pain.  He has known arthritis in that left knee and had meniscal root repair in the past.  Patient does have increased body mass index at 361.  He says he is lost 14 pounds over the last several weeks.  He is working part-time as a Technical brewer but that does involve about 4 hours of walking a day.  He does state that he has some days where he does not have pain in the knee but majority of the days he does.              ROS: All systems reviewed are negative as they relate to the chief complaint within the history of present illness.  Patient denies  fevers or chills.   Assessment & Plan: Visit Diagnoses:  1. Left knee pain, unspecified chronicity   2. Class 3 severe obesity with body mass index (BMI) of 45.0 to 49.9 in adult, unspecified obesity type, unspecified whether serious comorbidity present ([place])     Plan: Impression is morbid obesity and left knee arthritis with not much in the way of effusion today.  Has about 1 mm of joint space remaining.  Not a candidate for total knee replacement due to his weight.  I think at his current weight and activity level he will have progressive arthritis and will develop into more severe daily and debilitating pain.  I am going to refer him to [facility] for evaluation for bariatric surgery.  Also gave him a note today stating that he has end-stage left knee arthritis and needs to walk less than or equal to 2 hours/day due to high likelihood of progression of his symptoms without such restrictions.  I will see him back as needed  Follow-Up Instructions: Return if symptoms worsen or fail to  improve.   Orders:  Orders Placed This Encounter  Procedures  . XR Knee 1-2 Views Left  . Amb Referral to Bariatric Surgery   No orders of the defined types were placed in this encounter.     Procedures: No procedures performed   Clinical Data: No additional findings.  Objective: Vital Signs: Ht 6' (1.829 m)   Wt (!) 361 lb (163.7 kg)   BMI 48.96 kg/m   Physical Exam:   Constitutional: Patient appears well-developed HEENT:  Head: Normocephalic Eyes:EOM are normal Neck: Normal range of motion Cardiovascular: Normal rate Pulmonary/chest: Effort normal Neurologic: Patient is alert Skin: Skin is warm Psychiatric: Patient has normal mood and affect    Ortho Exam: Ortho exam demonstrates full active and passive range of motion of the right knee.  Left knee has trace effusion but periretinacular tenderness.  Lacks about 5 degrees of full extension but has flexion past 90.  Collateral crucial ligaments are stable.  Pedal pulses palpable.  Does have antalgic gait to the left.  Specialty Comments:  No specialty comments available.  Imaging: Xr Knee 1-2 Views Left  Result Date: 11/02/2018 AP lateral left knee reviewed.  Medial joint space narrowing is present with spurring.  Sclerosis  on the tibial plateau also present.  No acute fracture or dislocation.    PMFS History: Patient Active Problem List   Diagnosis Date Noted  . Pain of right heel 01/12/2017  . Other tear of medial meniscus, current injury, left knee, subsequent encounter 03/02/2016  . Clavicle fracture 07/14/2015   Past Medical History:  Diagnosis Date  . Anxiety   . Complication of anesthesia     2007- Colonoscopy had to be stopped due not breathing, 2016- at [facility]- BP pressure bottomed  . Depression   . Dysrhythmia   . GERD (gastroesophageal reflux disease)   . Headache   . History of kidney stones    x2  passed  . Hyperlipidemia   . Hypertension   . Sleep apnea    USES CPAP  . TBI  (traumatic brain injury) ([place]) 2002  . Tinea versicolor    arm    Family History  Problem Relation Age of Onset  . CAD Father   . CAD Paternal Grandfather     Past Surgical History:  Procedure Laterality Date  . ADENOIDECTOMY    . CARPAL TUNNEL RELEASE Right 07/14/2015   Procedure: CARPAL TUNNEL RELEASE;  Surgeon: Meredith Pel, MD;  Location: [facility];  Service: Orthopedics;  Laterality: Right;  . carpel tunnel left Left   . CHOLECYSTECTOMY  1994  . CLAVICLE HARDWARE REMOVAL Left 03/2009  . CLAVICLE SURGERY Left 07/2008  . COLONOSCOPY     polyp  . ESOPHAGOGASTRODUODENOSCOPY ENDOSCOPY    . FINGER GANGLION CYST EXCISION Right   . HAND SURGERY Right    ring finger - cyst  . HEMORRHOID SURGERY  2016  . KNEE ARTHROSCOPY WITH MENISCAL REPAIR Left 02/01/2016   Procedure: KNEE ARTHROSCOPY WITH MENISCAL ROOT REPAIR VS DEBRIDEMENT;  Surgeon: Meredith Pel, MD;  Location: [facility];  Service: Orthopedics;  Laterality: Left;  . MOUTH SURGERY     wisdom teeth removed  . ORIF CLAVICLE FRACTURE Right 07/14/2015  . ORIF CLAVICULAR FRACTURE Right 07/14/2015   Procedure: OPEN REDUCTION INTERNAL FIXATION (ORIF) RIGHT CLAVICLE FRACTURE, RIGHT CARPAL TUNNEL RELEASE;  Surgeon: Meredith Pel, MD;  Location: [facility];  Service: Orthopedics;  Laterality: Right;  . SIGMOIDOSCOPY    . TONSILLECTOMY     Social History   Occupational History  . Occupation: Retired-police  Tobacco Use  . Smoking status: Never Smoker  . Smokeless tobacco: Never Used  Substance and Sexual Activity  . Alcohol use: Yes    Alcohol/week: 7.0 standard drinks    Types: 3 Glasses of wine, 3 Cans of beer, 1 Shots of liquor per week    Comment: occassional  . Drug use: No  . Sexual activity: Not on file

## 2018-12-07 ENCOUNTER — Encounter: Payer: Self-pay | Admitting: Podiatry

## 2018-12-07 ENCOUNTER — Other Ambulatory Visit: Payer: Self-pay

## 2018-12-07 ENCOUNTER — Ambulatory Visit: Payer: 59 | Admitting: Podiatry

## 2018-12-07 DIAGNOSIS — B351 Tinea unguium: Secondary | ICD-10-CM | POA: Diagnosis not present

## 2018-12-07 DIAGNOSIS — M79674 Pain in right toe(s): Secondary | ICD-10-CM | POA: Diagnosis not present

## 2018-12-07 DIAGNOSIS — M79675 Pain in left toe(s): Secondary | ICD-10-CM

## 2018-12-19 NOTE — Progress Notes (Signed)
Subjective: 53 year old male presents the office today for Evaluation of nail fungus as well as elongated toenails that he cannot trim himself.  He is asking for a new prescription of antifungal medicine.  The heels are doing much better. Denies any systemic complaints such as fevers, chills, nausea, vomiting. No acute changes since last appointment, and no other complaints at this time.   Objective: AAO x3, NAD DP/PT pulses palpable bilaterally, CRT less than 3 seconds Nails appear to be hypertrophic, dystrophic, elongated x2 to bilateral hallux toenails.  No redness or drainage or signs of infection.  Nails are tender also with pressure in shoes. No pain with calf compression, swelling, warmth, erythema  Assessment: Bilateral hallux onychomycosis, symptomatic  Plan: -All treatment options discussed with the patient including all alternatives, risks, complications.  -Debrided nails x2 without any complications or bleeding.  Refill compound cream for onychomycosis. -Continue stretching exercises for the pectoralis daily.  Continue wearing supportive shoes and orthotics. -Patient encouraged to call the office with any questions, concerns, change in symptoms.   Return in about 6 months (around 06/06/2019), or if symptoms worsen or fail to improve.  Trula Slade DPM

## 2019-03-05 ENCOUNTER — Other Ambulatory Visit: Payer: Self-pay

## 2019-03-05 ENCOUNTER — Ambulatory Visit (INDEPENDENT_AMBULATORY_CARE_PROVIDER_SITE_OTHER): Payer: 59 | Admitting: Podiatry

## 2019-03-05 DIAGNOSIS — B353 Tinea pedis: Secondary | ICD-10-CM | POA: Diagnosis not present

## 2019-03-05 DIAGNOSIS — B351 Tinea unguium: Secondary | ICD-10-CM

## 2019-03-05 MED ORDER — KETOCONAZOLE 2 % EX CREA: 1.0000 "application " | TOPICAL_CREAM | Freq: Every day | CUTANEOUS | 2 refills | Status: DC

## 2019-03-06 NOTE — Progress Notes (Signed)
Subjective: 53 year old male presents the office today for concerns of athlete's foot.  He lives with his girlfriend who has athlete's foot he was also seen today and she wants to have his feet checked.  He denies any itching to his feet.  He is still using topical antifungal for nail fungus.  He has no new concerns. Denies any systemic complaints such as fevers, chills, nausea, vomiting. No acute changes since last appointment, and no other complaints at this time.   Objective: AAO x3, NAD DP/PT pulses palpable bilaterally, CRT less than 3 seconds Bilateral hallux nails and the lesser digit toenails are dystrophic and discolored but there is clearing on the proximal nail border.  The distal two thirds is yellow discoloration with brown discoloration.  There is very minimal tinea pedis identified.  No open sores or drainage. No open lesions or pre-ulcerative lesions.  No pain with calf compression, swelling, warmth, erythema  Assessment: Tinea pedis, onychomycosis  Plan: -All treatment options discussed with the patient including all alternatives, risks, complications.  -Continue current topical antifungal for the nail fungus.  Prescribed ketoconazole.  Discussed external measures to help with fungus as well. -Patient encouraged to call the office with any questions, concerns, change in symptoms.   Trula Slade DPM

## 2019-06-06 ENCOUNTER — Ambulatory Visit: Payer: 59 | Admitting: Podiatry

## 2019-06-07 ENCOUNTER — Ambulatory Visit: Payer: 59 | Admitting: Podiatry

## 2019-08-13 ENCOUNTER — Other Ambulatory Visit: Payer: Self-pay | Admitting: Surgery

## 2019-09-16 ENCOUNTER — Other Ambulatory Visit: Payer: Self-pay

## 2019-09-16 ENCOUNTER — Encounter (HOSPITAL_BASED_OUTPATIENT_CLINIC_OR_DEPARTMENT_OTHER): Payer: Self-pay | Admitting: Surgery

## 2019-09-20 ENCOUNTER — Encounter (HOSPITAL_BASED_OUTPATIENT_CLINIC_OR_DEPARTMENT_OTHER)
Admission: RE | Admit: 2019-09-20 | Discharge: 2019-09-20 | Disposition: A | Discharge status: Home / Self Care | Payer: 59 | Source: Ambulatory Visit | Attending: Surgery | Admitting: Surgery

## 2019-09-20 ENCOUNTER — Other Ambulatory Visit (HOSPITAL_COMMUNITY)
Admission: RE | Admit: 2019-09-20 | Discharge: 2019-09-20 | Disposition: A | Discharge status: Home / Self Care | Payer: 59 | Source: Ambulatory Visit | Attending: Surgery | Admitting: Surgery

## 2019-09-20 DIAGNOSIS — Z20822 Contact with and (suspected) exposure to covid-19: Secondary | ICD-10-CM | POA: Insufficient documentation

## 2019-09-20 DIAGNOSIS — Z01818 Encounter for other preprocedural examination: Secondary | ICD-10-CM | POA: Insufficient documentation

## 2019-09-20 LAB — SARS CORONAVIRUS 2 (TAT 6-24 HRS): SARS Coronavirus 2: NEGATIVE

## 2019-09-20 LAB — BASIC METABOLIC PANEL
Anion gap: 8 (ref 5–15)
BUN: 17 mg/dL (ref 6–20)
CO2: 27 mmol/L (ref 22–32)
Calcium: 9.1 mg/dL (ref 8.9–10.3)
Chloride: 105 mmol/L (ref 98–111)
Creatinine, Ser: 0.99 mg/dL (ref 0.61–1.24)
GFR calc Af Amer: >60 mL/min (ref >60)
GFR calc non Af Amer: >60 mL/min (ref >60)
Glucose, Bld: 101 mg/dL — ABNORMAL HIGH (ref 70–99)
Potassium: 4.7 mmol/L (ref 3.5–5.1)
Sodium: 140 mmol/L (ref 135–145)

## 2019-09-20 MED ORDER — ENSURE PRE-SURGERY PO LIQD: 296.0000 mL | Freq: Once | ORAL | Status: DC

## 2019-09-20 NOTE — Progress Notes (Signed)
Anesthesia consult per Dr.Rose, will proceed with surgery as scheduled.

## 2019-09-20 NOTE — Progress Notes (Signed)

## 2019-09-23 ENCOUNTER — Encounter (HOSPITAL_BASED_OUTPATIENT_CLINIC_OR_DEPARTMENT_OTHER): Payer: Self-pay | Admitting: Surgery

## 2019-09-23 NOTE — H&P (Signed)
Adam Stephenson  Location: [facility] Patient #: [id number] DOB: [birth year] Single / Language: Adam Stephenson / Race: White Male   History of Present Illness  The patient is a 54 year old male who presents with an inguinal hernia.  Chief complaint: Umbilical hernia  This is a 54 year old gentleman referred by Dr. Kenton Kingfisher for evaluation of umbilical hernia. He reports recently noticing a bulge above his umbilicus. He has noticed discomfort there was some mild intermittent sharp pain. He has had no nausea, vomiting, or obstructive symptoms. He is otherwise without complaints. He does use CPAP for sleep apnea. He has had multiple surgical procedures without problems anesthesia. He has no cardiac history.   Past Surgical History [name], CMA;  Gallbladder Surgery - Laparoscopic  Hemorrhoidectomy  Knee Surgery  Left. Oral Surgery  Shoulder Surgery  Bilateral. Tonsillectomy   Diagnostic Studies History [name], CMA Colonoscopy  5-10 years ago  Allergies [name], [place]; No Known Drug Allergies  [08/13/2019]: Allergies Reconciled   Medication History [name], CMA; buPROPion HCl ER (XL) (150MG  Tablet ER 24HR, Oral) Active. Pantoprazole Sodium (40MG  Tablet DR, Oral) Active. cloNIDine HCl (0.1MG  Tablet, Oral) Active. Lipitor (40MG  Tablet, Oral) Active. Olmesartan Medoxomil (40MG  Tablet, Oral) Active. Medications Reconciled  Social History [name], CMA; Alcohol use  Moderate alcohol use. Caffeine use  Tea. No drug use  Tobacco use  Never smoker.  Family History [name], [place]; Alcohol Abuse  Father, Mother. Arthritis  Mother. Colon Polyps  Father. Depression  Father. Hypertension  Father, Mother, Sister. Respiratory Condition  Father.  Other Problems [name], CMA;  Anxiety Disorder  Arthritis  Back Pain  Cholelithiasis  Depression  Diverticulosis  Gastroesophageal Reflux Disease   Hemorrhoids  High blood pressure  Hypercholesterolemia  Kidney Stone  Migraine Headache  Other disease, cancer, significant illness  Sleep Apnea     Review of Systems [name] CMA; General Not Present- Appetite Loss, Chills, Fatigue, Fever, Night Sweats, Weight Gain and Weight Loss. Skin Not Present- Change in Wart/Mole, Dryness, Hives, Jaundice, New Lesions, Non-Healing Wounds, Rash and Ulcer. HEENT Present- Wears glasses/contact lenses. Not Present- Earache, Hearing Loss, Hoarseness, Nose Bleed, Oral Ulcers, Ringing in the Ears, Seasonal Allergies, Sinus Pain, Sore Throat, Visual Disturbances and Yellow Eyes. Respiratory Present- Snoring. Not Present- Bloody sputum, Chronic Cough, Difficulty Breathing and Wheezing. Breast Not Present- Breast Mass, Breast Pain, Nipple Discharge and Skin Changes. Cardiovascular Not Present- Chest Pain, Difficulty Breathing Lying Down, Leg Cramps, Palpitations, Rapid Heart Rate, Shortness of Breath and Swelling of Extremities. Gastrointestinal Present- Abdominal Pain, Change in Bowel Habits and Indigestion. Not Present- Bloating, Bloody Stool, Chronic diarrhea, Constipation, Difficulty Swallowing, Excessive gas, Gets full quickly at meals, Hemorrhoids, Nausea, Rectal Pain and Vomiting. Male Genitourinary Present- Urgency and Urine Leakage. Not Present- Blood in Urine, Change in Urinary Stream, Frequency, Impotence, Nocturia and Painful Urination. Musculoskeletal Present- Back Pain, Joint Pain, Joint Stiffness, Muscle Pain and Swelling of Extremities. Not Present- Muscle Weakness. Neurological Not Present- Decreased Memory, Fainting, Headaches, Numbness, Seizures, Tingling, Tremor, Trouble walking and Weakness. Psychiatric Present- Anxiety, Change in Sleep Pattern and Depression. Not Present- Bipolar, Fearful and Frequent crying. Endocrine Present- Excessive Hunger, Heat Intolerance and Hot flashes. Not Present- Cold Intolerance, Hair Changes and  New Diabetes. Hematology Not Present- Blood Thinners, Easy Bruising, Excessive bleeding, Gland problems, HIV and Persistent Infections.  Vitals  Weight: 378 lb Height: 72in Body Surface Area: 2.79 m Body Mass Index: 51.27 kg/m  Temp.: 4F (Tympanic)  Pulse: 88 (Regular)  BP: 132/84(Sitting, Left Arm, Standard)     Physical Exam The physical exam findings are as follows: Note: This is a large gentleman  The abdomen is soft and nontender. There is a hernia just above the umbilicus which is minimally tender and easily reducible. There are no other masses or hernias. There is no hepatosplenomegaly    Assessment & Plan  UMBILICAL HERNIA (U51.4)  Impression: This is a patient with an umbilical hernia. I discussed the diagnosis with him in detail. We discussed the reasons for repairing hernias. We discussed mesh with repairs. We discussed reasonings to use mesh. I extensively reviewed his records in the electronic medical records. I recommend an open umbilical hernia repair with mesh. I explained the surgical procedure in detail. I discussed the risks which includes but is not limited to bleeding, infection, injury to surrounding structures, the use of mesh, hernia recurrence, cardiopulmonary issues, postoperative recovery, etc. After discussion, he wishes to proceed with surgery which will be scheduled.

## 2019-09-24 ENCOUNTER — Other Ambulatory Visit: Payer: Self-pay

## 2019-09-24 ENCOUNTER — Ambulatory Visit (HOSPITAL_BASED_OUTPATIENT_CLINIC_OR_DEPARTMENT_OTHER): Payer: 59 | Admitting: Certified Registered Nurse Anesthetist

## 2019-09-24 ENCOUNTER — Encounter (HOSPITAL_BASED_OUTPATIENT_CLINIC_OR_DEPARTMENT_OTHER): Payer: Self-pay | Admitting: Surgery

## 2019-09-24 ENCOUNTER — Encounter (HOSPITAL_BASED_OUTPATIENT_CLINIC_OR_DEPARTMENT_OTHER)
Admission: RE | Disposition: A | Discharge status: Home / Self Care | Payer: Self-pay | Source: Home / Self Care | Attending: Surgery

## 2019-09-24 ENCOUNTER — Ambulatory Visit (HOSPITAL_BASED_OUTPATIENT_CLINIC_OR_DEPARTMENT_OTHER)
Admission: RE | Admit: 2019-09-24 | Discharge: 2019-09-24 | Disposition: A | Discharge status: Home / Self Care | Payer: 59 | Attending: Surgery | Admitting: Surgery

## 2019-09-24 DIAGNOSIS — Z79899 Other long term (current) drug therapy: Secondary | ICD-10-CM | POA: Insufficient documentation

## 2019-09-24 DIAGNOSIS — Z6841 Body Mass Index (BMI) 40.0 and over, adult: Secondary | ICD-10-CM | POA: Insufficient documentation

## 2019-09-24 DIAGNOSIS — E7800 Pure hypercholesterolemia, unspecified: Secondary | ICD-10-CM | POA: Diagnosis not present

## 2019-09-24 DIAGNOSIS — M1990 Unspecified osteoarthritis, unspecified site: Secondary | ICD-10-CM | POA: Diagnosis not present

## 2019-09-24 DIAGNOSIS — F329 Major depressive disorder, single episode, unspecified: Secondary | ICD-10-CM | POA: Insufficient documentation

## 2019-09-24 DIAGNOSIS — G4730 Sleep apnea, unspecified: Secondary | ICD-10-CM | POA: Diagnosis not present

## 2019-09-24 DIAGNOSIS — K429 Umbilical hernia without obstruction or gangrene: Secondary | ICD-10-CM | POA: Diagnosis not present

## 2019-09-24 DIAGNOSIS — K219 Gastro-esophageal reflux disease without esophagitis: Secondary | ICD-10-CM | POA: Diagnosis not present

## 2019-09-24 DIAGNOSIS — I10 Essential (primary) hypertension: Secondary | ICD-10-CM | POA: Insufficient documentation

## 2019-09-24 DIAGNOSIS — Z8249 Family history of ischemic heart disease and other diseases of the circulatory system: Secondary | ICD-10-CM | POA: Insufficient documentation

## 2019-09-24 HISTORY — PX: UMBILICAL HERNIA REPAIR: SHX196

## 2019-09-24 HISTORY — PX: INSERTION OF MESH: SHX5868

## 2019-09-24 HISTORY — DX: Umbilical hernia without obstruction or gangrene: K42.9

## 2019-09-24 SURGERY — REPAIR, HERNIA, UMBILICAL, ADULT
Anesthesia: General | Site: Abdomen

## 2019-09-24 MED ORDER — CHLORHEXIDINE GLUCONATE CLOTH 2 % EX PADS: 6.0000 | MEDICATED_PAD | Freq: Once | CUTANEOUS | Status: DC

## 2019-09-24 MED ORDER — FENTANYL CITRATE (PF) 100 MCG/2ML IJ SOLN
INTRAMUSCULAR | Status: AC
  Filled 2019-09-24: qty 2

## 2019-09-24 MED ORDER — ACETAMINOPHEN 500 MG PO TABS
ORAL_TABLET | ORAL | Status: AC
  Filled 2019-09-24: qty 2

## 2019-09-24 MED ORDER — LIDOCAINE 2% (20 MG/ML) 5 ML SYRINGE
INTRAMUSCULAR | Status: AC
  Filled 2019-09-24: qty 5

## 2019-09-24 MED ORDER — BUPIVACAINE HCL (PF) 0.5 % IJ SOLN
INTRAMUSCULAR | Status: DC | PRN
  Administered 2019-09-24: 20 mL

## 2019-09-24 MED ORDER — GABAPENTIN 300 MG PO CAPS
300.0000 mg | ORAL_CAPSULE | ORAL | Status: AC
  Administered 2019-09-24: 300 mg via ORAL

## 2019-09-24 MED ORDER — OXYCODONE HCL 5 MG/5ML PO SOLN: 5.0000 mg | Freq: Once | ORAL | Status: AC | PRN

## 2019-09-24 MED ORDER — ARTIFICIAL TEARS OPHTHALMIC OINT
TOPICAL_OINTMENT | OPHTHALMIC | Status: AC
  Filled 2019-09-24: qty 3.5

## 2019-09-24 MED ORDER — BUPIVACAINE HCL (PF) 0.5 % IJ SOLN
INTRAMUSCULAR | Status: AC
  Filled 2019-09-24: qty 30

## 2019-09-24 MED ORDER — DEXAMETHASONE SODIUM PHOSPHATE 10 MG/ML IJ SOLN
INTRAMUSCULAR | Status: AC
  Filled 2019-09-24: qty 1

## 2019-09-24 MED ORDER — ACETAMINOPHEN 500 MG PO TABS
1000.0000 mg | ORAL_TABLET | ORAL | Status: AC
  Administered 2019-09-24: 1000 mg via ORAL

## 2019-09-24 MED ORDER — OXYCODONE HCL 5 MG PO TABS: 5.0000 mg | ORAL_TABLET | Freq: Four times a day (QID) | ORAL | 0 refills | Status: DC | PRN

## 2019-09-24 MED ORDER — CELECOXIB 200 MG PO CAPS
ORAL_CAPSULE | ORAL | Status: AC
  Filled 2019-09-24: qty 2

## 2019-09-24 MED ORDER — GABAPENTIN 300 MG PO CAPS
ORAL_CAPSULE | ORAL | Status: AC
  Filled 2019-09-24: qty 1

## 2019-09-24 MED ORDER — OXYCODONE HCL 5 MG PO TABS
5.0000 mg | ORAL_TABLET | Freq: Once | ORAL | Status: AC | PRN
  Administered 2019-09-24: 5 mg via ORAL

## 2019-09-24 MED ORDER — LACTATED RINGERS IV SOLN: INTRAVENOUS | Status: DC

## 2019-09-24 MED ORDER — OXYCODONE HCL 5 MG PO TABS
ORAL_TABLET | ORAL | Status: AC
  Filled 2019-09-24: qty 1

## 2019-09-24 MED ORDER — GLYCOPYRROLATE 0.2 MG/ML IJ SOLN
INTRAMUSCULAR | Status: DC | PRN
  Administered 2019-09-24: .2 mg via INTRAVENOUS

## 2019-09-24 MED ORDER — MIDAZOLAM HCL 2 MG/2ML IJ SOLN
INTRAMUSCULAR | Status: DC | PRN
Start: 2019-09-24 — End: 2019-09-24
  Administered 2019-09-24: 2 mg via INTRAVENOUS

## 2019-09-24 MED ORDER — CEFAZOLIN SODIUM-DEXTROSE 2-4 GM/100ML-% IV SOLN
INTRAVENOUS | Status: AC
  Filled 2019-09-24: qty 100

## 2019-09-24 MED ORDER — ARTIFICIAL TEARS OPHTHALMIC OINT
TOPICAL_OINTMENT | OPHTHALMIC | Status: DC | PRN
Start: 2019-09-24 — End: 2019-09-24
  Administered 2019-09-24: 1 via OPHTHALMIC

## 2019-09-24 MED ORDER — HYDROMORPHONE HCL 1 MG/ML IJ SOLN: 0.2500 mg | INTRAMUSCULAR | Status: DC | PRN

## 2019-09-24 MED ORDER — DEXAMETHASONE SODIUM PHOSPHATE 10 MG/ML IJ SOLN
INTRAMUSCULAR | Status: DC | PRN
  Administered 2019-09-24: 10 mg via INTRAVENOUS

## 2019-09-24 MED ORDER — ONDANSETRON HCL 4 MG/2ML IJ SOLN
INTRAMUSCULAR | Status: DC | PRN
Start: 2019-09-24 — End: 2019-09-24
  Administered 2019-09-24: 4 mg via INTRAVENOUS

## 2019-09-24 MED ORDER — PROMETHAZINE HCL 25 MG/ML IJ SOLN: 6.2500 mg | INTRAMUSCULAR | Status: DC | PRN

## 2019-09-24 MED ORDER — LIDOCAINE HCL (CARDIAC) PF 100 MG/5ML IV SOSY
PREFILLED_SYRINGE | INTRAVENOUS | Status: DC | PRN
Start: 2019-09-24 — End: 2019-09-24
  Administered 2019-09-24: 80 mg via INTRATRACHEAL

## 2019-09-24 MED ORDER — PROPOFOL 10 MG/ML IV BOLUS
INTRAVENOUS | Status: AC
  Filled 2019-09-24: qty 20

## 2019-09-24 MED ORDER — CELECOXIB 200 MG PO CAPS
400.0000 mg | ORAL_CAPSULE | ORAL | Status: AC
  Administered 2019-09-24: 400 mg via ORAL

## 2019-09-24 MED ORDER — PROPOFOL 10 MG/ML IV BOLUS
INTRAVENOUS | Status: DC | PRN
  Administered 2019-09-24: 300 mg via INTRAVENOUS
  Administered 2019-09-24: 100 mg via INTRAVENOUS

## 2019-09-24 MED ORDER — ONDANSETRON HCL 4 MG/2ML IJ SOLN
INTRAMUSCULAR | Status: AC
  Filled 2019-09-24: qty 2

## 2019-09-24 MED ORDER — FENTANYL CITRATE (PF) 100 MCG/2ML IJ SOLN
INTRAMUSCULAR | Status: DC | PRN
  Administered 2019-09-24 (×2): 50 ug via INTRAVENOUS

## 2019-09-24 MED ORDER — MIDAZOLAM HCL 2 MG/2ML IJ SOLN
INTRAMUSCULAR | Status: AC
  Filled 2019-09-24: qty 2

## 2019-09-24 MED ORDER — ROCURONIUM BROMIDE 10 MG/ML (PF) SYRINGE
PREFILLED_SYRINGE | INTRAVENOUS | Status: AC
  Filled 2019-09-24: qty 10

## 2019-09-24 MED ORDER — CEFAZOLIN SODIUM-DEXTROSE 1-4 GM/50ML-% IV SOLN
INTRAVENOUS | Status: AC
  Filled 2019-09-24: qty 50

## 2019-09-24 MED ORDER — DEXTROSE 5 % IV SOLN
3.0000 g | INTRAVENOUS | Status: AC
  Administered 2019-09-24: 3 g via INTRAVENOUS
  Filled 2019-09-24: qty 3

## 2019-09-24 MED ORDER — MEPERIDINE HCL 25 MG/ML IJ SOLN: 6.2500 mg | INTRAMUSCULAR | Status: DC | PRN

## 2019-09-24 MED ORDER — AMISULPRIDE (ANTIEMETIC) 5 MG/2ML IV SOLN: 10.0000 mg | Freq: Once | INTRAVENOUS | Status: DC | PRN

## 2019-09-24 MED ORDER — GLYCOPYRROLATE PF 0.2 MG/ML IJ SOSY
PREFILLED_SYRINGE | INTRAMUSCULAR | Status: AC
  Filled 2019-09-24: qty 1

## 2019-09-24 SURGICAL SUPPLY — 33 items
BLADE CLIPPER SURG (BLADE) ×2 IMPLANT
BLADE SURG 15 STRL LF DISP TIS (BLADE) ×1 IMPLANT
BLADE SURG 15 STRL SS (BLADE) ×2
CHLORAPREP W/TINT 26 (MISCELLANEOUS) ×2 IMPLANT
COVER BACK TABLE 60X90IN (DRAPES) ×2 IMPLANT
COVER MAYO STAND STRL (DRAPES) ×2 IMPLANT
DERMABOND ADVANCED (GAUZE/BANDAGES/DRESSINGS) ×1
DERMABOND ADVANCED .7 DNX12 (GAUZE/BANDAGES/DRESSINGS) ×1 IMPLANT
DRAPE LAPAROTOMY 100X72 PEDS (DRAPES) ×2 IMPLANT
DRAPE UTILITY XL STRL (DRAPES) ×2 IMPLANT
ELECT REM PT RETURN 9FT ADLT (ELECTROSURGICAL) ×2
ELECTRODE REM PT RTRN 9FT ADLT (ELECTROSURGICAL) ×1 IMPLANT
GLOVE SURG SIGNA 7.5 PF LTX (GLOVE) ×2 IMPLANT
GOWN STRL REUS W/ TWL LRG LVL3 (GOWN DISPOSABLE) ×1 IMPLANT
GOWN STRL REUS W/ TWL XL LVL3 (GOWN DISPOSABLE) ×1 IMPLANT
GOWN STRL REUS W/TWL LRG LVL3 (GOWN DISPOSABLE) ×2
GOWN STRL REUS W/TWL XL LVL3 (GOWN DISPOSABLE) ×2
MESH VENTRALEX ST 1-7/10 CRC S (Mesh General) ×2 IMPLANT
NEEDLE HYPO 25X1 1.5 SAFETY (NEEDLE) ×2 IMPLANT
NS IRRIG 1000ML POUR BTL (IV SOLUTION) ×2 IMPLANT
PACK BASIN DAY SURGERY FS (CUSTOM PROCEDURE TRAY) ×2 IMPLANT
PENCIL SMOKE EVACUATOR (MISCELLANEOUS) ×2 IMPLANT
SLEEVE SCD COMPRESS KNEE MED (MISCELLANEOUS) ×2 IMPLANT
SPONGE LAP 4X18 RFD (DISPOSABLE) ×2 IMPLANT
SUT MNCRL AB 4-0 PS2 18 (SUTURE) ×2 IMPLANT
SUT NOVA NAB DX-16 0-1 5-0 T12 (SUTURE) ×4 IMPLANT
SUT NOVA NAB GS-21 1 T12 (SUTURE) IMPLANT
SUT VIC AB 2-0 SH 27 (SUTURE)
SUT VIC AB 2-0 SH 27XBRD (SUTURE) IMPLANT
SUT VIC AB 3-0 SH 27 (SUTURE) ×2
SUT VIC AB 3-0 SH 27X BRD (SUTURE) ×1 IMPLANT
SYR CONTROL 10ML LL (SYRINGE) ×2 IMPLANT
TOWEL GREEN STERILE FF (TOWEL DISPOSABLE) ×2 IMPLANT

## 2019-09-24 NOTE — Anesthesia Procedure Notes (Signed)
Procedure Name: LMA Insertion Date/Time: 09/24/2019 9:38 AM Performed by: Collier Bullock, CRNA Pre-anesthesia Checklist: Patient identified, Emergency Drugs available, Suction available and Patient being monitored Patient Re-evaluated:Patient Re-evaluated prior to induction Oxygen Delivery Method: Circle system utilized Preoxygenation: Pre-oxygenation with 100% oxygen Induction Type: IV induction Ventilation: Nasal airway inserted- appropriate to patient size LMA: LMA inserted LMA Size: 5.0 Number of attempts: 1 Placement Confirmation: positive ETCO2 and breath sounds checked- equal and bilateral Tube secured with: Tape Dental Injury: Teeth and Oropharynx as per pre-operative assessment

## 2019-09-24 NOTE — Anesthesia Postprocedure Evaluation (Signed)
Anesthesia Post Note  Patient: Adam Stephenson  Procedure(s) Performed: UMBILICAL HERNIA REPAIR WITH MESH (N/A Abdomen) INSERTION OF MESH (N/A Abdomen)     Patient location during evaluation: PACU Anesthesia Type: General Level of consciousness: awake and alert Pain management: pain level controlled Vital Signs Assessment: post-procedure vital signs reviewed and stable Respiratory status: spontaneous breathing, nonlabored ventilation, respiratory function stable and patient connected to nasal cannula oxygen Cardiovascular status: blood pressure returned to baseline and stable Postop Assessment: no apparent nausea or vomiting Anesthetic complications: no   No complications documented.  Last Vitals:  Vitals:   09/24/19 1100 09/24/19 1130  BP:  127/75  Pulse:  64  Resp:  16  Temp:  36.6 C  SpO2: 93% 95%    Last Pain:  Vitals:   09/24/19 1145  TempSrc:   PainSc: [place] Ireene B[place]

## 2019-09-24 NOTE — Transfer of Care (Signed)
Immediate Anesthesia Transfer of Care Note  Patient: Adam Stephenson  Procedure(s) Performed: UMBILICAL HERNIA REPAIR WITH MESH (N/A Abdomen) INSERTION OF MESH (N/A Abdomen)  Patient Location: PACU  Anesthesia Type:General  Level of Consciousness: awake, alert  and patient cooperative  Airway & Oxygen Therapy: Patient Spontanous Breathing and Patient connected to face mask oxygen  Post-op Assessment: Report given to RN, Post -op Vital signs reviewed and stable, Patient moving all extremities X 4 and Patient able to stick tongue midline  Post vital signs: Reviewed and stable  Last Vitals:  Vitals Value Taken Time  BP 145/97 09/24/19 1015  Temp    Pulse 73 09/24/19 1016  Resp 26 09/24/19 1016  SpO2 99 % 09/24/19 1016  Vitals shown include unvalidated device data.  Last Pain:  Vitals:   09/24/19 0852  TempSrc: Oral  PainSc: 0-No pain      Patients Stated Pain Goal: 6 (10/08/80 8833)  Complications: No complications documented.

## 2019-09-24 NOTE — Discharge Instructions (Signed)
[facility] _______[facility]  UMBILICAL OR INGUINAL HERNIA REPAIR: POST OP INSTRUCTIONS  Always review your discharge instruction sheet given to you by the facility where your surgery was performed. IF YOU HAVE DISABILITY OR FAMILY LEAVE FORMS, YOU MUST BRING THEM TO THE OFFICE FOR PROCESSING.   DO NOT GIVE THEM TO YOUR DOCTOR.  1. A  prescription for pain medication may be given to you upon discharge.  Take your pain medication as prescribed, if needed.  If narcotic pain medicine is not needed, then you may take acetaminophen (Tylenol) or ibuprofen (Advil) as needed. 2. Take your usually prescribed medications unless otherwise directed. If you need a refill on your pain medication, please contact your pharmacy.  They will contact our office to request authorization. Prescriptions will not be filled after 5 pm or on week-ends. 3. You should follow a light diet the first 24 hours after arrival home, such as soup and crackers, etc.  Be sure to include lots of fluids daily.  Resume your normal diet the day after surgery. 4.Most patients will experience some swelling and bruising around the umbilicus or in the groin and scrotum.  Ice packs and reclining will help.  Swelling and bruising can take several days to resolve.  6. It is common to experience some constipation if taking pain medication after surgery.  Increasing fluid intake and taking a stool softener (such as Colace) will usually help or prevent this problem from occurring.  A mild laxative (Milk of Magnesia or Miralax) should be taken according to package directions if there are no bowel movements after 48 hours. 7. Unless discharge instructions indicate otherwise, you may remove your bandages 24-48 hours after surgery, and you may shower at that time.  You may have steri-strips (small skin tapes) in place directly over the incision.  These strips should be left on the skin for 7-10 days.  If your surgeon used skin glue on the  incision, you may shower in 24 hours.  The glue will flake off over the next 2-3 weeks.  Any sutures or staples will be removed at the office during your follow-up visit. 8. ACTIVITIES:  You may resume regular (light) daily activities beginning the next day--such as daily self-care, walking, climbing stairs--gradually increasing activities as tolerated.  You may have sexual intercourse when it is comfortable.  Refrain from any heavy lifting or straining until approved by your doctor.  a.You may drive when you are no longer taking prescription pain medication, you can comfortably wear a seatbelt, and you can safely maneuver your car and apply brakes. b.RETURN TO WORK:   _____________________________________________  9.You should see your doctor in the office for a follow-up appointment approximately 2-3 weeks after your surgery.  Make sure that you call for this appointment within a day or two after you arrive home to insure a convenient appointment time. 10.OTHER INSTRUCTIONS: __OK TO SHOWER STARTING TOMORROW ICE PACK, TYLENOL, AND IBUPROFEN ALSO FOR PAIN NO LIFTING MORE THAN 15 POUNDS FOR 4 WEEKS_______________________    _____________________________________  WHEN TO CALL YOUR DOCTOR: 1. Fever over 101.0 2. Inability to urinate 3. Nausea and/or vomiting 4. Extreme swelling or bruising 5. Continued bleeding from incision. 6. Increased pain, redness, or drainage from the incision  The clinic staff is available to answer your questions during regular business hours.  Please don't hesitate to call and ask to speak to one of the nurses for clinical concerns.  If you have a medical emergency, go to the nearest  emergency room or call 911.  A surgeon from [facility] is always on call at the hospital   [address] [place], [place], [place]  [address] ?  [address], [place], [place]   [phone] ? [phone] ? FAX [phone] Web site:  [email or web address]  Post Anesthesia Home Care Instructions  Activity: Get plenty of rest for the remainder of the day. A responsible individual must stay with you for 24 hours following the procedure.  For the next 24 hours, DO NOT: -Drive a car -Paediatric nurse -Drink alcoholic beverages -Take any medication unless instructed by your physician -Make any legal decisions or sign important papers.  Meals: Start with liquid foods such as gelatin or soup. Progress to regular foods as tolerated. Avoid greasy, spicy, heavy foods. If nausea and/or vomiting occur, drink only clear liquids until the nausea and/or vomiting subsides. Call your physician if vomiting continues.  Special Instructions/Symptoms: Your throat may feel dry or sore from the anesthesia or the breathing tube placed in your throat during surgery. If this causes discomfort, gargle with warm salt water. The discomfort should disappear within 24 hours.  If you had a scopolamine patch placed behind your ear for the management of post- operative nausea and/or vomiting:  1. The medication in the patch is effective for 72 hours, after which it should be removed.  Wrap patch in a tissue and discard in the trash. Wash hands thoroughly with soap and water. 2. You may remove the patch earlier than 72 hours if you experience unpleasant side effects which may include dry mouth, dizziness or visual disturbances. 3. Avoid touching the patch. Wash your hands with soap and water after contact with the patch.

## 2019-09-24 NOTE — Anesthesia Preprocedure Evaluation (Signed)
Anesthesia Evaluation  Patient identified by MRN, date of birth, ID band Patient awake    Reviewed: Allergy & Precautions, NPO status , Patient's Chart, lab work & pertinent test results  Airway Mallampati: II  TM Distance: >3 FB Neck ROM: Full    Dental no notable dental hx. (+) Teeth Intact, Dental Advisory Given   Pulmonary sleep apnea ,    Pulmonary exam normal breath sounds clear to auscultation       Cardiovascular hypertension, Pt. on medications Normal cardiovascular exam Rhythm:Regular Rate:Normal     Neuro/Psych  Headaches, Anxiety Depression    GI/Hepatic GERD  ,  Endo/Other  Morbid obesity  Renal/GU      Musculoskeletal   Abdominal (+) + obese,   Peds  Hematology   Anesthesia Other Findings   Reproductive/Obstetrics                             Anesthesia Physical  Anesthesia Plan  ASA: III  Anesthesia Plan: General   Post-op Pain Management:    Induction: Intravenous  PONV Risk Score and Plan: 2 and Ondansetron, Midazolam and Treatment may vary due to age or medical condition  Airway Management Planned: LMA  Additional Equipment:   Intra-op Plan:   Post-operative Plan: Extubation in OR  Informed Consent: I have reviewed the patients History and Physical, chart, labs and discussed the procedure including the risks, benefits and alternatives for the proposed anesthesia with the patient or authorized representative who has indicated his/her understanding and acceptance.     Dental advisory given  Plan Discussed with: CRNA and Anesthesiologist  Anesthesia Plan Comments:         Anesthesia Quick Evaluation

## 2019-09-24 NOTE — Op Note (Signed)
UMBILICAL HERNIA REPAIR WITH MESH, INSERTION OF MESH  Procedure Note  Adam Stephenson 09/24/2019   Pre-op Diagnosis: UMBILICAL HERNIA     Post-op Diagnosis: same  Procedure(s): UMBILICAL HERNIA REPAIR WITH MESH INSERTION OF MESH  Surgeon(s): Coralie Keens, MD  Anesthesia: General  Staff:  Circulator: Izora Ribas, RN Scrub Person: Murvin Natal  Estimated Blood Loss: Minimal                Findings: The patient had a small less than 2 cm fascial defect just above the umbilicus containing some preperitoneal fat.  It was repaired with a 4.3 cm round ventral Prolene patch from [facility]  Procedure: Patient brought to the operating room identifies correct patient.  He was placed prone on the operating room table and general anesthesia was induced.  His abdomen was prepped and draped in usual sterile fashion.  I anesthetized the skin above the umbilicus with Marcaine.  I then made a longitudinal incision with a scalpel.  I then carried this down to the hernia sac with the cautery.  I dissected the sac circumferentially down to the exit from the fascia.  I then excised the sac in its entirety.  It contained only preperitoneal fat.  A 4.3 cm round Prolene patch from [facility] was brought to the field.  I placed it through the fascia opening and then pulled it up against the peritoneum with the ties.  I sutured the mesh in place circumferentially with #1 Novafil sutures.  I then cut the ties and closed the fascia over the top of the mesh with 2 separate figure-of-eight #1 Novafil sutures.  Wide coverage of the defect appeared to be achieved and closure.  I then anesthetized the fascia further with Marcaine.  Hemostasis appeared to be achieved.  I then closed the subcutaneous tissue with interrupted 3-0 Vicryl sutures and closed the skin with a running 4-0 Monocryl.  Dermabond was then applied.  The patient tolerated the procedure well.  All the counts were correct at the end of the procedure.   The patient was then extubated in the operating room and taken in a stable condition to the recovery room.          Coralie Keens   Date: 09/24/2019  Time: 10:09 AM

## 2019-09-24 NOTE — Interval H&P Note (Signed)
History and Physical Interval Note: no change in H and P  09/24/2019 9:01 AM  Adam Stephenson  has presented today for surgery, with the diagnosis of UMBILICAL HERNIA.  The various methods of treatment have been discussed with the patient and family. After consideration of risks, benefits and other options for treatment, the patient has consented to  Procedure(s) with comments: [place] (N/A) - LMA as a surgical intervention.  The patient's history has been reviewed, patient examined, no change in status, stable for surgery.  I have reviewed the patient's chart and labs.  Questions were answered to the patient's satisfaction.     Coralie Keens

## 2019-09-25 NOTE — Addendum Note (Signed)
Addendum  created 09/25/19 1327 by Trueman Worlds, Ernesta Amble, CRNA   Charge Capture section accepted

## 2019-09-26 ENCOUNTER — Encounter (HOSPITAL_BASED_OUTPATIENT_CLINIC_OR_DEPARTMENT_OTHER): Payer: Self-pay | Admitting: Surgery

## 2020-02-17 ENCOUNTER — Other Ambulatory Visit: Payer: Self-pay

## 2020-02-17 ENCOUNTER — Ambulatory Visit: Payer: Self-pay

## 2020-02-17 ENCOUNTER — Ambulatory Visit (INDEPENDENT_AMBULATORY_CARE_PROVIDER_SITE_OTHER): Payer: 59 | Admitting: Orthopedic Surgery

## 2020-02-17 DIAGNOSIS — M25552 Pain in left hip: Secondary | ICD-10-CM

## 2020-02-17 DIAGNOSIS — M25562 Pain in left knee: Secondary | ICD-10-CM

## 2020-02-17 DIAGNOSIS — M25561 Pain in right knee: Secondary | ICD-10-CM | POA: Diagnosis not present

## 2020-02-17 DIAGNOSIS — M25551 Pain in right hip: Secondary | ICD-10-CM

## 2020-02-17 DIAGNOSIS — M170 Bilateral primary osteoarthritis of knee: Secondary | ICD-10-CM

## 2020-02-17 DIAGNOSIS — G8929 Other chronic pain: Secondary | ICD-10-CM

## 2020-02-23 ENCOUNTER — Encounter: Payer: Self-pay | Admitting: Orthopedic Surgery

## 2020-02-23 DIAGNOSIS — M170 Bilateral primary osteoarthritis of knee: Secondary | ICD-10-CM | POA: Diagnosis not present

## 2020-02-23 DIAGNOSIS — M25561 Pain in right knee: Secondary | ICD-10-CM | POA: Diagnosis not present

## 2020-02-23 MED ORDER — LIDOCAINE HCL 1 % IJ SOLN
5.0000 mL | INTRAMUSCULAR | Status: AC | PRN
  Administered 2020-02-23: 5 mL

## 2020-02-23 MED ORDER — METHYLPREDNISOLONE ACETATE 40 MG/ML IJ SUSP
40.0000 mg | INTRAMUSCULAR | Status: AC | PRN
  Administered 2020-02-23: 40 mg via INTRA_ARTICULAR

## 2020-02-23 MED ORDER — BUPIVACAINE HCL 0.25 % IJ SOLN
4.0000 mL | INTRAMUSCULAR | Status: AC | PRN
  Administered 2020-02-23: 4 mL via INTRA_ARTICULAR

## 2020-02-23 NOTE — Progress Notes (Addendum)
Office Visit Note   Patient: Adam Stephenson           Date of Birth: April 18, 1965           MRN: [id number] Visit Date: 02/17/2020 Requested by: Shirline Frees, MD [place],  [place] [id number] PCP: Shirline Frees, MD  Subjective: Chief Complaint  Patient presents with  . Right Knee - Pain  . Left Knee - Pain  . Left Hip - Pain  . Right Hip - Pain    HPI: Adam Stephenson is a 54 year old patient with bilateral knee pain.  He was walking yesterday prior to his clinic visit when he stepped off a curb and felt a pop in his right knee.  This was similar to what happened with his left knee when he had meniscal root evulsion and attempted repair.  Took oxycodone and Motrin with some relief.  Denies any groin pain.  The longer he stands the more pain he has.  Denies any mechanical symptoms.  Reports pain primarily on the medial aspect of the knee.  Patient weighs close to 400 pounds.  He is not an operative candidate for meniscal repair or knee replacement at this time              ROS: All systems reviewed are negative as they relate to the chief complaint within the history of present illness.  Patient denies  fevers or chills.   Assessment & Plan: Visit Diagnoses:  1. Bilateral hip pain   2. Acute pain of right knee   3. Chronic pain of left knee     Plan: Impression is right knee pain with effusion and likely meniscal root tear with pre-existing severe medial compartment arthritis.  Plan is aspiration and injection of the knee.  Note for work no standing greater than 1 hour for the next 8 weeks due to his knee injury.  Follow-up with me as needed  Follow-Up Instructions: Return if symptoms worsen or fail to improve.   Orders:  Orders Placed This Encounter  Procedures  . XR HIPS BILAT W OR W/O PELVIS 3-4 VIEWS  . XR Knee 1-2 Views Right  . XR KNEE 3 VIEW LEFT   No orders of the defined types were placed in this encounter.     Procedures: Large Joint Inj: R  knee on 02/23/2020 7:24 PM Indications: diagnostic evaluation, joint swelling and pain Details: 18 G 1.5 in needle, superolateral approach  Arthrogram: No  Medications: 5 mL lidocaine 1 %; 40 mg methylPREDNISolone acetate 40 MG/ML; 4 mL bupivacaine 0.25 % Outcome: tolerated well, no immediate complications Procedure, treatment alternatives, risks and benefits explained, specific risks discussed. Consent was given by the patient. Immediately prior to procedure a time out was called to verify the correct patient, procedure, equipment, support staff and site/side marked as required. Patient was prepped and draped in the usual sterile fashion.       Clinical Data: No additional findings.  Objective: Vital Signs: There were no vitals taken for this visit.  Physical Exam:   Constitutional: Patient appears well-developed HEENT:  Head: Normocephalic Eyes:EOM are normal Neck: Normal range of motion Cardiovascular: Normal rate Pulmonary/chest: Effort normal Neurologic: Patient is alert Skin: Skin is warm Psychiatric: Patient has normal mood and affect    Ortho Exam: Ortho exam demonstrates antalgic gait to the right.  Extensor mechanism is intact.  Collateral and cruciate ligaments intact.  Medial greater than lateral joint line tenderness is present.  No  masses lymphadenopathy or skin changes noted in that right knee region.  No groin pain with internal extra rotation of either leg.  No nerve root tension signs.  No paresthesias L1-S1 bilaterally.  Some of his "hip pain" may actually be coming from the back.  He may or may not want to work that up in the near future.  Specialty Comments:  No specialty comments available.  Imaging: No results found.   PMFS History: Patient Active Problem List   Diagnosis Date Noted  . Pain of right heel 01/12/2017  . Other tear of medial meniscus, current injury, left knee, subsequent encounter 03/02/2016  . Clavicle fracture 07/14/2015   Past  Medical History:  Diagnosis Date  . Anxiety   . Complication of anesthesia     2007- Colonoscopy had to be stopped due not breathing, 2016- at [facility]- BP pressure bottomed  . Depression   . GERD (gastroesophageal reflux disease)   . Headache   . History of kidney stones    x2  passed  . Hyperlipidemia   . Hypertension   . Sleep apnea    USES CPAP  . TBI (traumatic brain injury) ([place]) 2002  . Tinea versicolor    arm  . Umbilical hernia     Family History  Problem Relation Age of Onset  . CAD Father   . CAD Paternal Grandfather     Past Surgical History:  Procedure Laterality Date  . ADENOIDECTOMY    . CARPAL TUNNEL RELEASE Right 07/14/2015   Procedure: CARPAL TUNNEL RELEASE;  Surgeon: Meredith Pel, MD;  Location: [facility];  Service: Orthopedics;  Laterality: Right;  . carpel tunnel left Left   . CHOLECYSTECTOMY  1994  . CLAVICLE HARDWARE REMOVAL Left 03/2009  . CLAVICLE SURGERY Left 07/2008  . COLONOSCOPY     polyp  . ESOPHAGOGASTRODUODENOSCOPY ENDOSCOPY    . FINGER GANGLION CYST EXCISION Right   . HAND SURGERY Right    ring finger - cyst  . HEMORRHOID SURGERY  2016  . INSERTION OF MESH N/A 09/24/2019   Procedure: INSERTION OF MESH;  Surgeon: Coralie Keens, MD;  Location: [facility];  Service: General;  Laterality: N/A;  . KNEE ARTHROSCOPY WITH MENISCAL REPAIR Left 02/01/2016   Procedure: KNEE ARTHROSCOPY WITH MENISCAL ROOT REPAIR VS DEBRIDEMENT;  Surgeon: Meredith Pel, MD;  Location: [facility];  Service: Orthopedics;  Laterality: Left;  . MOUTH SURGERY     wisdom teeth removed  . ORIF CLAVICLE FRACTURE Right 07/14/2015  . ORIF CLAVICULAR FRACTURE Right 07/14/2015   Procedure: OPEN REDUCTION INTERNAL FIXATION (ORIF) RIGHT CLAVICLE FRACTURE, RIGHT CARPAL TUNNEL RELEASE;  Surgeon: Meredith Pel, MD;  Location: [facility];  Service: Orthopedics;  Laterality: Right;  . SIGMOIDOSCOPY    . TONSILLECTOMY    . UMBILICAL HERNIA REPAIR N/A 09/24/2019    Procedure: UMBILICAL HERNIA REPAIR WITH MESH;  Surgeon: Coralie Keens, MD;  Location: [facility];  Service: General;  Laterality: N/A;   Social History   Occupational History  . Occupation: Retired-police  Tobacco Use  . Smoking status: Never Smoker  . Smokeless tobacco: Never Used  Substance and Sexual Activity  . Alcohol use: Yes    Alcohol/week: 7.0 standard drinks    Types: 3 Glasses of wine, 3 Cans of beer, 1 Shots of liquor per week    Comment: occassional  . Drug use: No  . Sexual activity: Not on file

## 2020-03-05 ENCOUNTER — Ambulatory Visit (INDEPENDENT_AMBULATORY_CARE_PROVIDER_SITE_OTHER): Payer: 59 | Admitting: Orthopedic Surgery

## 2020-03-05 ENCOUNTER — Other Ambulatory Visit: Payer: Self-pay

## 2020-03-05 VITALS — Ht 71.0 in | Wt 394.0 lb

## 2020-03-05 DIAGNOSIS — M25561 Pain in right knee: Secondary | ICD-10-CM | POA: Diagnosis not present

## 2020-03-05 MED ORDER — ACETAMINOPHEN-CODEINE #3 300-30 MG PO TABS: 1.0000 | ORAL_TABLET | Freq: Two times a day (BID) | ORAL | 0 refills | Status: DC | PRN

## 2020-03-05 MED ORDER — TRAMADOL HCL 50 MG PO TABS: 50.0000 mg | ORAL_TABLET | Freq: Three times a day (TID) | ORAL | 0 refills | Status: DC | PRN

## 2020-03-08 ENCOUNTER — Encounter: Payer: Self-pay | Admitting: Orthopedic Surgery

## 2020-03-08 NOTE — Progress Notes (Signed)
Office Visit Note   Patient: Adam Stephenson           Date of Birth: 1965-05-29           MRN: [id number] Visit Date: 03/05/2020 Requested by: Shirline Frees, MD [place],  [place] [address] PCP: Shirline Frees, MD  Subjective: Chief Complaint  Patient presents with  . Right Knee - Pain    HPI: [name] is a 54 year old patient with worsening right knee pain.  He had an injury several weeks ago.  Had left knee pain and arthritis from meniscal root tear.  We attempted to repair that but that repair has likely failed.  His right knee pain is becoming extreme and difficulty with weightbearing.  He has had an aspiration and injection which did not give him sustained relief.  He is having difficulty weightbearing.  Patient weighs 394 height 511.  Feels catching and swelling.  He has been taking some tramadol.  He believes he may have to eventually go into a wheelchair if his pain does not improve.              ROS: All systems reviewed are negative as they relate to the chief complaint within the history of present illness.  Patient denies  fevers or chills.   Assessment & Plan: Visit Diagnoses:  1. Acute pain of right knee     Plan: Impression is acute right knee pain.  This could be meniscal root tear versus tibial plateau stress fracture which is worsening.  The difference between those 2 diagnoses would make a difference in treatment.  Need MRI scan of the right knee to evaluate for meniscal root evulsion and tear versus tibial plateau fracture.  Ultram prescribed along with Tylenol 3 along with pain management referral.  He needs to lose approximately 120 pounds to come close to the BMI 40 that he would need for knee replacement.  He has tried these things in the past without too much success.  Follow-up after MRI scan.  Follow-Up Instructions: Return if symptoms worsen or fail to improve.   Orders:  Orders Placed This Encounter  Procedures  . MR Knee Right  w/o contrast  . Ambulatory referral to Pain Clinic   Meds ordered this encounter  Medications  . traMADol (ULTRAM) 50 MG tablet    Sig: Take 1 tablet (50 mg total) by mouth every 8 (eight) hours as needed.    Dispense:  45 tablet    Refill:  0  . acetaminophen-codeine (TYLENOL #3) 300-30 MG tablet    Sig: Take 1 tablet by mouth every 12 (twelve) hours as needed for moderate pain.    Dispense:  45 tablet    Refill:  0      Procedures: No procedures performed   Clinical Data: No additional findings.  Objective: Vital Signs: Ht 5\' 11"  (1.803 m)   Wt (!) 394 lb (178.7 kg)   BMI 54.95 kg/m   Physical Exam:   Constitutional: Patient appears well-developed HEENT:  Head: Normocephalic Eyes:EOM are normal Neck: Normal range of motion Cardiovascular: Normal rate Pulmonary/chest: Effort normal Neurologic: Patient is alert Skin: Skin is warm Psychiatric: Patient has normal mood and affect    Ortho Exam: Ortho exam demonstrates antalgic gait to the right but no effusion in the right knee today.  Does have medial sided tenderness more than lateral side.  Extensor mechanism is intact.  No groin pain with internal X rotation of the leg.  Pedal pulses palpable.  Not much warmth right knee versus left knee.  Specialty Comments:  No specialty comments available.  Imaging: No results found.   PMFS History: Patient Active Problem List   Diagnosis Date Noted  . Pain of right heel 01/12/2017  . Other tear of medial meniscus, current injury, left knee, subsequent encounter 03/02/2016  . Clavicle fracture 07/14/2015   Past Medical History:  Diagnosis Date  . Anxiety   . Complication of anesthesia     2007- Colonoscopy had to be stopped due not breathing, 2016- at [facility]- BP pressure bottomed  . Depression   . GERD (gastroesophageal reflux disease)   . Headache   . History of kidney stones    x2  passed  . Hyperlipidemia   . Hypertension   . Sleep apnea    USES CPAP   . TBI (traumatic brain injury) ([place]) 2002  . Tinea versicolor    arm  . Umbilical hernia     Family History  Problem Relation Age of Onset  . CAD Father   . CAD Paternal Grandfather     Past Surgical History:  Procedure Laterality Date  . ADENOIDECTOMY    . CARPAL TUNNEL RELEASE Right 07/14/2015   Procedure: CARPAL TUNNEL RELEASE;  Surgeon: Meredith Pel, MD;  Location: [facility];  Service: Orthopedics;  Laterality: Right;  . carpel tunnel left Left   . CHOLECYSTECTOMY  1994  . CLAVICLE HARDWARE REMOVAL Left 03/2009  . CLAVICLE SURGERY Left 07/2008  . COLONOSCOPY     polyp  . ESOPHAGOGASTRODUODENOSCOPY ENDOSCOPY    . FINGER GANGLION CYST EXCISION Right   . HAND SURGERY Right    ring finger - cyst  . HEMORRHOID SURGERY  2016  . INSERTION OF MESH N/A 09/24/2019   Procedure: INSERTION OF MESH;  Surgeon: Coralie Keens, MD;  Location: [facility];  Service: General;  Laterality: N/A;  . KNEE ARTHROSCOPY WITH MENISCAL REPAIR Left 02/01/2016   Procedure: KNEE ARTHROSCOPY WITH MENISCAL ROOT REPAIR VS DEBRIDEMENT;  Surgeon: Meredith Pel, MD;  Location: [facility];  Service: Orthopedics;  Laterality: Left;  . MOUTH SURGERY     wisdom teeth removed  . ORIF CLAVICLE FRACTURE Right 07/14/2015  . ORIF CLAVICULAR FRACTURE Right 07/14/2015   Procedure: OPEN REDUCTION INTERNAL FIXATION (ORIF) RIGHT CLAVICLE FRACTURE, RIGHT CARPAL TUNNEL RELEASE;  Surgeon: Meredith Pel, MD;  Location: [facility];  Service: Orthopedics;  Laterality: Right;  . SIGMOIDOSCOPY    . TONSILLECTOMY    . UMBILICAL HERNIA REPAIR N/A 09/24/2019   Procedure: UMBILICAL HERNIA REPAIR WITH MESH;  Surgeon: Coralie Keens, MD;  Location: [facility];  Service: General;  Laterality: N/A;   Social History   Occupational History  . Occupation: Retired-police  Tobacco Use  . Smoking status: Never Smoker  . Smokeless tobacco: Never Used  Substance and Sexual Activity  . Alcohol use: Yes     Alcohol/week: 7.0 standard drinks    Types: 3 Glasses of wine, 3 Cans of beer, 1 Shots of liquor per week    Comment: occassional  . Drug use: No  . Sexual activity: Not on file

## 2020-03-10 ENCOUNTER — Encounter: Payer: Self-pay | Admitting: Physical Medicine & Rehabilitation

## 2020-03-11 ENCOUNTER — Ambulatory Visit: Payer: 59 | Admitting: Orthopedic Surgery

## 2020-03-15 ENCOUNTER — Ambulatory Visit (HOSPITAL_BASED_OUTPATIENT_CLINIC_OR_DEPARTMENT_OTHER)
Admission: RE | Admit: 2020-03-15 | Discharge: 2020-03-15 | Disposition: A | Discharge status: Home / Self Care | Payer: 59 | Source: Ambulatory Visit | Attending: Orthopedic Surgery | Admitting: Orthopedic Surgery

## 2020-03-15 ENCOUNTER — Other Ambulatory Visit: Payer: Self-pay

## 2020-03-15 DIAGNOSIS — M25561 Pain in right knee: Secondary | ICD-10-CM | POA: Diagnosis present

## 2020-03-16 NOTE — Progress Notes (Signed)
Can u call him is as we suspected - meniscal root tear like the other side and sig edema in the tib plat c/w stress rxn - needs diminished wb on the leg for 2 weeks but no guarentees on what happens after that - may be wc as we discussed - pls also check vit d level if he has not had one recently thx

## 2020-03-18 NOTE — Progress Notes (Signed)
thx

## 2020-04-09 ENCOUNTER — Encounter: Payer: 59 | Admitting: Physical Medicine & Rehabilitation

## 2020-05-08 ENCOUNTER — Encounter: Payer: 59 | Attending: Physical Medicine & Rehabilitation | Admitting: Physical Medicine & Rehabilitation

## 2020-05-08 ENCOUNTER — Other Ambulatory Visit: Payer: Self-pay

## 2020-05-08 ENCOUNTER — Encounter: Payer: Self-pay | Admitting: Physical Medicine & Rehabilitation

## 2020-05-08 VITALS — BP 146/93 | HR 75 | Temp 98.3°F | Ht 71.0 in | Wt 367.0 lb

## 2020-05-08 DIAGNOSIS — G8929 Other chronic pain: Secondary | ICD-10-CM | POA: Diagnosis not present

## 2020-05-08 DIAGNOSIS — M1711 Unilateral primary osteoarthritis, right knee: Secondary | ICD-10-CM | POA: Diagnosis not present

## 2020-05-08 DIAGNOSIS — M25561 Pain in right knee: Secondary | ICD-10-CM

## 2020-05-08 DIAGNOSIS — S83242D Other tear of medial meniscus, current injury, left knee, subsequent encounter: Secondary | ICD-10-CM | POA: Diagnosis present

## 2020-05-08 NOTE — Patient Instructions (Signed)
Call if pain increases.

## 2020-05-08 NOTE — Progress Notes (Signed)
Subjective:    Patient ID: Adam Stephenson, male    DOB: 13-Nov-1965, 55 y.o.   MRN: [id number]  HPI    55 yo male with severe B knee OA who was referred by orthopedics.  The patient has a history of morbid obesity, BMI 51, hypertension, obstructive sleep apnea, remote brain injury.  Right knee has been more painful than the left side and is the main issue at this time.  He was evaluated by orthopedic surgery and felt to be a good candidate for total knee arthroplasty but had to lose approximately 120 pounds before he was eligible to do so.  His MRI showed multiple abnormalities mainly meniscal degeneration loss of chondral surface, as well as right tibial plateau fracture which was small.  Orthopedics okayed patient for weightbearing as tolerated.  Additionally has had some hip pain although hip x-rays showed only mild degenerative changes.  He does have left knee pain but this is much less severe. Right knee pain moderate ~4-5/10 range despite limiting activities.  The patient feels like his pain would be severe if he was walking like he usually does.  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 Left knee arthroscopy for meniscal tear in April of 2021  Pt comfortable in sitting but with R knee straight in standing , has severe pain  Weight loss of 38lb in last 2 months.  Using ibuprofen 800mg  but not daily  CLINICAL DATA:  Medial right knee pain for 3 weeks with decreased range of motion   EXAM: MRI OF THE RIGHT KNEE WITHOUT CONTRAST   TECHNIQUE: Multiplanar, multisequence MR imaging of the knee was performed. No intravenous contrast was administered.   COMPARISON:  X-ray 02/17/2020   FINDINGS: Technical note: Motion degraded exam. Exam quality is also degraded as a standard extremity coil was unable to be utilized secondary to patient habitus.   MENISCI   Medial meniscus: Complete radial tear of the  posterior horn of the medial meniscus at the posterior root attachment (series 8, image 6; series 5, image 14) with extrusion of the meniscal body. Background of intrasubstance degeneration.   Lateral meniscus: Intrasubstance degeneration without a well-defined tear. There appears to be artifact within the meniscus on sagittal PD sequences.   LIGAMENTS   Cruciates:  Intact ACL and PCL.   Collaterals: MCL is medially bowed by underlying meniscal extrusion and medial compartment osteophytosis. Mild MCL and pes anserine bursitis. Lateral collateral ligament complex intact.   CARTILAGE   Patellofemoral: No focal chondral defect is evident on motion degraded imaging.   Medial: Full-thickness chondral loss of the weight-bearing medial compartment with associated subchondral signal changes.   Lateral:  No focal cartilage defect is evident.   Joint: Moderate to large knee joint effusion. Rounded 1.5 cm loose body present within the lateral aspect of the suprapatellar pouch (series 10, image 21). Mild edema within Hoffa's fat. Small volume deep infrapatellar bursal fluid.   Popliteal Fossa: No Baker's cyst. Popliteus tendon is grossly intact. There is mild popliteus muscle edema.   Extensor Mechanism: Intact quadriceps tendon and patellar tendon. Mild distal patellar tendinosis.   Bones: Subchondral fracture at the periphery of the medial tibial plateau with extensive surrounding bone marrow edema (series 7, image 13; series 10, image 8). Medial compartment joint space narrowing with marginal osteophytosis.   Other: Small volume prepatellar fluid. Nonspecific subcutaneous edema most pronounced anteriorly.   IMPRESSION: 1. Complete radial  tear of the posterior horn of the medial meniscus at the posterior root attachment with extrusion of the meniscal body. 2. Subchondral fracture at the periphery of the medial tibial plateau with extensive surrounding bone marrow edema. 3.  Full-thickness chondral loss of the weight-bearing medial compartment. 4. Moderate-to-large knee joint effusion with 1.5 cm loose body within the lateral aspect of the suprapatellar pouch. 5. Mild MCL and pes anserine bursitis. 6. Mild distal patellar tendinosis. Small volume prepatellar fluid which may reflect bursitis.     Electronically Signed   By: Davina Poke D.O.   On: 03/15/2020 11:40 Pain Inventory Average Pain 4 Pain Right Now 4 My pain is intermittent, constant, sharp, burning, dull, stabbing, tingling and aching  In the last 24 hours, has pain interfered with the following? General activity 5 Relation with others 5 Enjoyment of life 6 What TIME of day is your pain at its worst? evening, night and varies Sleep (in general) Fair  Pain is worse with: walking, bending and some activites Pain improves with: rest, medication and heat Relief from Meds: 5  use a cane how many minutes can you walk? 15-20 mins ability to climb steps?  yes do you drive?  yes  employed # of hrs/week 20-25 hours per wk [employer] Do you have any goals in this area?  yes  numbness trouble walking  Any changes since last visit?  no CT/MRI New patient MRI in Nov 2021  Any changes since last visit?  no New Patient    Family History  Problem Relation Age of Onset  . CAD Father   . CAD Paternal Grandfather    Social History   Socioeconomic History  . Marital status: Single    Spouse name: Not on file  . Number of children: 0  . Years of education: Not on file  . Highest education level: Not on file  Occupational History  . Occupation: Retired-police  Tobacco Use  . Smoking status: Never Smoker  . Smokeless tobacco: Never Used  Substance and Sexual Activity  . Alcohol use: Yes    Alcohol/week: 7.0 standard drinks    Types: 3 Glasses of wine, 3 Cans of beer, 1 Shots of liquor per week    Comment: occassional  . Drug use: No  . Sexual activity: Not on file  Other  Topics Concern  . Not on file  Social History Narrative  . Not on file   Social Determinants of Health   Financial Resource Strain: Not on file  Food Insecurity: Not on file  Transportation Needs: Not on file  Physical Activity: Not on file  Stress: Not on file  Social Connections: Not on file   Past Surgical History:  Procedure Laterality Date  . ADENOIDECTOMY    . CARPAL TUNNEL RELEASE Right 07/14/2015   Procedure: CARPAL TUNNEL RELEASE;  Surgeon: Meredith Pel, MD;  Location: [facility];  Service: Orthopedics;  Laterality: Right;  . carpel tunnel left Left   . CHOLECYSTECTOMY  1994  . CLAVICLE HARDWARE REMOVAL Left 03/2009  . CLAVICLE SURGERY Left 07/2008  . COLONOSCOPY     polyp  . ESOPHAGOGASTRODUODENOSCOPY ENDOSCOPY    . FINGER GANGLION CYST EXCISION Right   . HAND SURGERY Right    ring finger - cyst  . HEMORRHOID SURGERY  2016  . INSERTION OF MESH N/A 09/24/2019   Procedure: INSERTION OF MESH;  Surgeon: Coralie Keens, MD;  Location: [facility];  Service: General;  Laterality: N/A;  .  KNEE ARTHROSCOPY WITH MENISCAL REPAIR Left 02/01/2016   Procedure: KNEE ARTHROSCOPY WITH MENISCAL ROOT REPAIR VS DEBRIDEMENT;  Surgeon: Meredith Pel, MD;  Location: [facility];  Service: Orthopedics;  Laterality: Left;  . MOUTH SURGERY     wisdom teeth removed  . ORIF CLAVICLE FRACTURE Right 07/14/2015  . ORIF CLAVICULAR FRACTURE Right 07/14/2015   Procedure: OPEN REDUCTION INTERNAL FIXATION (ORIF) RIGHT CLAVICLE FRACTURE, RIGHT CARPAL TUNNEL RELEASE;  Surgeon: Meredith Pel, MD;  Location: [facility];  Service: Orthopedics;  Laterality: Right;  . SIGMOIDOSCOPY    . TONSILLECTOMY    . UMBILICAL HERNIA REPAIR N/A 09/24/2019   Procedure: UMBILICAL HERNIA REPAIR WITH MESH;  Surgeon: Coralie Keens, MD;  Location: [facility];  Service: General;  Laterality: N/A;   Past Medical History:  Diagnosis Date  . Anxiety   . Complication of anesthesia     2007-  Colonoscopy had to be stopped due not breathing, 2016- at [facility]- BP pressure bottomed  . Depression   . GERD (gastroesophageal reflux disease)   . Headache   . History of kidney stones    x2  passed  . Hyperlipidemia   . Hypertension   . Sleep apnea    USES CPAP  . TBI (traumatic brain injury) ([facility]) 2002  . Tinea versicolor    arm  . Umbilical hernia    There were no vitals taken for this visit.  Opioid Risk Score:   Fall Risk Score:  `1  Depression screen PHQ 2/9  Depression screen PHQ 2/9 08/29/2017  Decreased Interest 0  Down, Depressed, Hopeless 0  PHQ - 2 Score 0   Review of Systems  Musculoskeletal: Positive for gait problem.       Pain in both knees  All other systems reviewed and are negative.      Objective:   Physical Exam Vitals and nursing note reviewed.  Constitutional:      Appearance: He is obese.  HENT:     Head: Normocephalic and atraumatic.  Eyes:     Extraocular Movements: Extraocular movements intact.     Conjunctiva/sclera: Conjunctivae normal.     Pupils: Pupils are equal, round, and reactive to light.  Cardiovascular:     Rate and Rhythm: Normal rate and regular rhythm.     Pulses: Normal pulses.     Heart sounds: Normal heart sounds. No murmur heard.   Pulmonary:     Effort: Pulmonary effort is normal. No respiratory distress.     Breath sounds: Normal breath sounds.  Abdominal:     General: Abdomen is flat.     Palpations: Abdomen is soft.     Tenderness: There is no abdominal tenderness.  Musculoskeletal:     Comments: Right knee flexion is to 90 degrees actively. He is able to extend fully on the right Left knee flexion is to 110 degrees actively he is able to extend fully on the left  There is a mild infrapatellar effusion on the right side but not on the left side.  There is no tenderness along the patellar tendon quadricep tendon medial or lateral joint line or in the popliteal space.  Skin:    General: Skin is warm and  dry.  Neurological:     Mental Status: He is alert and oriented to person, place, and time.     Cranial Nerves: No dysarthria or facial asymmetry.     Motor: No weakness.     Comments: Motor strength is 5/5 bilateral hip  flexor knee extensor ankle dorsiflexor 5/5 bilateral deltoid, bicep, tricep, grip  Psychiatric:        Mood and Affect: Mood normal.        Behavior: Behavior normal.           Assessment & Plan:  #1.  Right knee meniscal degeneration and end-stage osteoarthritis causing chronic right knee pain.  He has modified his activities considerably and has moderate pain which he feels like he can deal with.  He is using Voltaren gel on a as needed basis.  We discussed this would be more effective if used daily.  He does not use oral nonsteroidal anti-inflammatories every day but will intermittently take 800 mg.   At this point I do not think he needs any stronger prescription pain medications unless his pain levels increase. We also discussed the potential alternative of genicular nerve blocks to assess if radiofrequency of the genicular nerves on the right side would be of benefit in reducing his pain levels. His end goal is to lose enough weight to undergo right knee arthroplasty.  He hopes to accomplish this by the end of the year. I will see the patient back on a as needed basis.  He is to call if his pain levels increase and we can reassess.

## 2020-06-29 ENCOUNTER — Ambulatory Visit: Payer: 59 | Admitting: Orthopedic Surgery

## 2020-06-29 ENCOUNTER — Other Ambulatory Visit: Payer: Self-pay

## 2020-06-29 DIAGNOSIS — M25561 Pain in right knee: Secondary | ICD-10-CM | POA: Diagnosis not present

## 2020-06-29 DIAGNOSIS — M170 Bilateral primary osteoarthritis of knee: Secondary | ICD-10-CM | POA: Diagnosis not present

## 2020-07-02 ENCOUNTER — Encounter: Payer: Self-pay | Admitting: Orthopedic Surgery

## 2020-07-02 MED ORDER — LIDOCAINE HCL 1 % IJ SOLN
5.0000 mL | INTRAMUSCULAR | Status: AC | PRN
  Administered 2020-06-29: 5 mL

## 2020-07-02 NOTE — Progress Notes (Signed)
Office Visit Note   Patient: Adam Stephenson           Date of Birth: 1965-05-19           MRN: [id number] Visit Date: 06/29/2020 Requested by: Shirline Frees, MD [place],  [place] [address] PCP: Shirline Frees, MD  Subjective: Chief Complaint  Patient presents with  . Right Knee - Pain    HPI: Adam Stephenson is a 55 year old patient with bilateral knee arthritis.  Since his last visit he has lost 47 pounds.  Describes decreased right knee pain but still has pain at times which will wake him from sleep at night.  He does have sleep apnea.  He is in as needed pain management.  He may need total knee replacement sometime in the future when his body mass index goes below 40.              ROS: All systems reviewed are negative as they relate to the chief complaint within the history of present illness.  Patient denies  fevers or chills.   Assessment & Plan: Visit Diagnoses:  1. Acute pain of right knee   2. Primary osteoarthritis of both knees     Plan: Impression is right knee pain with effusion.  Pain has decreased some but he would like to have his knee aspirated today.  That has helped him in the past.  Knee is aspirated.  He should continue with nonweightbearing quadrant doing exercises and weight loss.  Follow-up as needed.  Follow-Up Instructions: Return if symptoms worsen or fail to improve.   Orders:  No orders of the defined types were placed in this encounter.  No orders of the defined types were placed in this encounter.     Procedures: Large Joint Inj: R knee on 06/29/2020 2:00 PM Indications: diagnostic evaluation, joint swelling and pain Details: 18 G 1.5 in needle, superolateral approach  Arthrogram: No  Medications: 5 mL lidocaine 1 % Outcome: tolerated well, no immediate complications Procedure, treatment alternatives, risks and benefits explained, specific risks discussed. Consent was given by the patient. Immediately prior to procedure  a time out was called to verify the correct patient, procedure, equipment, support staff and site/side marked as required. Patient was prepped and draped in the usual sterile fashion.       Clinical Data: No additional findings.  Objective: Vital Signs: There were no vitals taken for this visit.  Physical Exam:   Constitutional: Patient appears well-developed HEENT:  Head: Normocephalic Eyes:EOM are normal Neck: Normal range of motion Cardiovascular: Normal rate Pulmonary/chest: Effort normal Neurologic: Patient is alert Skin: Skin is warm Psychiatric: Patient has normal mood and affect    Ortho Exam: Ortho exam demonstrates moderate right knee effusion on the right no left knee effusion.  Extensor mechanism is intact.  Range of motion is actually pretty reasonable on both sides with full extension the past 90 degrees of flexion.  No groin pain with internal ex rotation of the leg.  Pedal pulses palpable.  Specialty Comments:  No specialty comments available.  Imaging: No results found.   PMFS History: Patient Active Problem List   Diagnosis Date Noted  . Pain of right heel 01/12/2017  . Other tear of medial meniscus, current injury, left knee, subsequent encounter 03/02/2016  . Clavicle fracture 07/14/2015   Past Medical History:  Diagnosis Date  . Anxiety   . Complication of anesthesia     2007- Colonoscopy had to be stopped  due not breathing, 2016- at [facility]- BP pressure bottomed  . Depression   . GERD (gastroesophageal reflux disease)   . Headache   . History of kidney stones    x2  passed  . Hyperlipidemia   . Hypertension   . Sleep apnea    USES CPAP  . TBI (traumatic brain injury) ([facility]) 2002  . Tinea versicolor    arm  . Umbilical hernia     Family History  Problem Relation Age of Onset  . CAD Father   . CAD Paternal Grandfather     Past Surgical History:  Procedure Laterality Date  . ADENOIDECTOMY    . CARPAL TUNNEL RELEASE Right  07/14/2015   Procedure: CARPAL TUNNEL RELEASE;  Surgeon: Meredith Pel, MD;  Location: [facility];  Service: Orthopedics;  Laterality: Right;  . carpel tunnel left Left   . CHOLECYSTECTOMY  1994  . CLAVICLE HARDWARE REMOVAL Left 03/2009  . CLAVICLE SURGERY Left 07/2008  . COLONOSCOPY     polyp  . ESOPHAGOGASTRODUODENOSCOPY ENDOSCOPY    . FINGER GANGLION CYST EXCISION Right   . HAND SURGERY Right    ring finger - cyst  . HEMORRHOID SURGERY  2016  . INSERTION OF MESH N/A 09/24/2019   Procedure: INSERTION OF MESH;  Surgeon: Coralie Keens, MD;  Location: [facility];  Service: General;  Laterality: N/A;  . KNEE ARTHROSCOPY WITH MENISCAL REPAIR Left 02/01/2016   Procedure: KNEE ARTHROSCOPY WITH MENISCAL ROOT REPAIR VS DEBRIDEMENT;  Surgeon: Meredith Pel, MD;  Location: [facility];  Service: Orthopedics;  Laterality: Left;  . MOUTH SURGERY     wisdom teeth removed  . ORIF CLAVICLE FRACTURE Right 07/14/2015  . ORIF CLAVICULAR FRACTURE Right 07/14/2015   Procedure: OPEN REDUCTION INTERNAL FIXATION (ORIF) RIGHT CLAVICLE FRACTURE, RIGHT CARPAL TUNNEL RELEASE;  Surgeon: Meredith Pel, MD;  Location: [facility];  Service: Orthopedics;  Laterality: Right;  . SIGMOIDOSCOPY    . TONSILLECTOMY    . UMBILICAL HERNIA REPAIR N/A 09/24/2019   Procedure: UMBILICAL HERNIA REPAIR WITH MESH;  Surgeon: Coralie Keens, MD;  Location: [facility];  Service: General;  Laterality: N/A;   Social History   Occupational History  . Occupation: Retired-police  Tobacco Use  . Smoking status: Never Smoker  . Smokeless tobacco: Never Used  Vaping Use  . Vaping Use: Never used  Substance and Sexual Activity  . Alcohol use: Yes    Alcohol/week: 7.0 standard drinks    Types: 3 Glasses of wine, 3 Cans of beer, 1 Shots of liquor per week    Comment: occassional  . Drug use: No  . Sexual activity: Not on file

## 2020-08-10 ENCOUNTER — Other Ambulatory Visit: Payer: Self-pay | Admitting: Surgical

## 2020-08-18 ENCOUNTER — Other Ambulatory Visit: Payer: Self-pay | Admitting: Surgical

## 2020-08-18 MED ORDER — MELOXICAM 15 MG PO TABS
15.0000 mg | ORAL_TABLET | Freq: Every day | ORAL | 2 refills | Status: DC | PRN
Start: 2020-08-18 — End: 2020-11-29

## 2020-08-18 NOTE — Telephone Encounter (Signed)
Sent in RX for mobic to take instead of refilling tramadol

## 2020-11-23 ENCOUNTER — Other Ambulatory Visit: Payer: Self-pay | Admitting: Surgical

## 2021-01-13 ENCOUNTER — Other Ambulatory Visit: Payer: Self-pay

## 2021-01-13 ENCOUNTER — Ambulatory Visit: Payer: 59 | Admitting: Orthopedic Surgery

## 2021-01-13 DIAGNOSIS — M25561 Pain in right knee: Secondary | ICD-10-CM | POA: Diagnosis not present

## 2021-01-13 DIAGNOSIS — M170 Bilateral primary osteoarthritis of knee: Secondary | ICD-10-CM | POA: Diagnosis not present

## 2021-01-13 DIAGNOSIS — M25562 Pain in left knee: Secondary | ICD-10-CM | POA: Diagnosis not present

## 2021-01-15 ENCOUNTER — Encounter: Payer: Self-pay | Admitting: Orthopedic Surgery

## 2021-01-15 MED ORDER — BUPIVACAINE HCL 0.25 % IJ SOLN
4.0000 mL | INTRAMUSCULAR | Status: AC | PRN
  Administered 2021-01-13: 4 mL via INTRA_ARTICULAR

## 2021-01-15 MED ORDER — LIDOCAINE HCL 1 % IJ SOLN
5.0000 mL | INTRAMUSCULAR | Status: AC | PRN
  Administered 2021-01-13: 5 mL

## 2021-01-15 MED ORDER — METHYLPREDNISOLONE ACETATE 40 MG/ML IJ SUSP
40.0000 mg | INTRAMUSCULAR | Status: AC | PRN
  Administered 2021-01-13: 40 mg via INTRA_ARTICULAR

## 2021-01-15 NOTE — Progress Notes (Signed)
Office Visit Note   Patient: Adam Stephenson           Date of Birth: 11/06/1965           MRN: [id number] Visit Date: 01/13/2021 Requested by: Shirline Frees, MD [address] [place],  [place] [address] PCP: Shirline Frees, MD  Subjective: Chief Complaint  Patient presents with   Other     Knee pain    HPI: [name] is a 55 year old patient with bilateral knee arthritis.  Right is more symptomatic than the left.  He did lose some weight recently but gained it back over the summer.  Hard for him to get started moving.  He did use a scooter when he was in [facility] recently.  Denies any groin pain and denies any low back pain.  The right knee gives way at times.  He remains verbally committed to weight loss in order to achieve a body mass index below 40 so that he can have his knees replaced.              ROS: All systems reviewed are negative as they relate to the chief complaint within the history of present illness.  Patient denies  fevers or chills.   Assessment & Plan: Visit Diagnoses: No diagnosis found.  Plan: Impression is bilateral knee arthritis right worse than left.  Both knees are stable today.  Mild effusion is present in both knees.  Both knees aspirated and injected today.  Continue with weight loss.  Follow-up as needed.  Follow-Up Instructions: No follow-ups on file.   Orders:  No orders of the defined types were placed in this encounter.  No orders of the defined types were placed in this encounter.     Procedures: Large Joint Inj: R knee on 01/13/2021 7:15 AM Indications: diagnostic evaluation, joint swelling and pain Details: 18 G 1.5 in needle, superolateral approach  Arthrogram: No  Medications: 5 mL lidocaine 1 %; 40 mg methylPREDNISolone acetate 40 MG/ML; 4 mL bupivacaine 0.25 % Outcome: tolerated well, no immediate complications Procedure, treatment alternatives, risks and benefits explained, specific risks discussed. Consent was  given by the patient. Immediately prior to procedure a time out was called to verify the correct patient, procedure, equipment, support staff and site/side marked as required. Patient was prepped and draped in the usual sterile fashion.    Large Joint Inj: L knee on 01/13/2021 7:16 AM Indications: diagnostic evaluation, joint swelling and pain Details: 18 G 1.5 in needle, superolateral approach  Arthrogram: No  Medications: 5 mL lidocaine 1 %; 40 mg methylPREDNISolone acetate 40 MG/ML; 4 mL bupivacaine 0.25 % Outcome: tolerated well, no immediate complications Procedure, treatment alternatives, risks and benefits explained, specific risks discussed. Consent was given by the patient. Immediately prior to procedure a time out was called to verify the correct patient, procedure, equipment, support staff and site/side marked as required. Patient was prepped and draped in the usual sterile fashion.      Clinical Data: No additional findings.  Objective: Vital Signs: There were no vitals taken for this visit.  Physical Exam:   Constitutional: Patient appears well-developed HEENT:  Head: Normocephalic Eyes:EOM are normal Neck: Normal range of motion Cardiovascular: Normal rate Pulmonary/chest: Effort normal Neurologic: Patient is alert Skin: Skin is warm Psychiatric: Patient has normal mood and affect   Ortho Exam: Ortho exam demonstrates full active and passive range of motion of the hips and ankles.  He is got slight varus alignment bilateral  knees but stable collateral cruciate ligaments.  Mild effusion present in both knees.  Extensor mechanism is intact.  Has about a 5 degree flexion contracture bilaterally.  Pedal pulses palpable.  Specialty Comments:  No specialty comments available.  Imaging: No results found.   PMFS History: Patient Active Problem List   Diagnosis Date Noted   Pain of right heel 01/12/2017   Other tear of medial meniscus, current injury, left knee,  subsequent encounter 03/02/2016   Clavicle fracture 07/14/2015   Past Medical History:  Diagnosis Date   Anxiety    Complication of anesthesia     2007- Colonoscopy had to be stopped due not breathing, 2016- at [facility]- BP pressure bottomed   Depression    GERD (gastroesophageal reflux disease)    Headache    History of kidney stones    x2  passed   Hyperlipidemia    Hypertension    Sleep apnea    USES CPAP   TBI (traumatic brain injury) ([place]) 2002   Tinea versicolor    arm   Umbilical hernia     Family History  Problem Relation Age of Onset   CAD Father    CAD Paternal Grandfather     Past Surgical History:  Procedure Laterality Date   ADENOIDECTOMY     CARPAL TUNNEL RELEASE Right 07/14/2015   Procedure: CARPAL TUNNEL RELEASE;  Surgeon: Meredith Pel, MD;  Location: [facility];  Service: Orthopedics;  Laterality: Right;   carpel tunnel left Left    CHOLECYSTECTOMY  1994   CLAVICLE HARDWARE REMOVAL Left 03/2009   CLAVICLE SURGERY Left 07/2008   COLONOSCOPY     polyp   ESOPHAGOGASTRODUODENOSCOPY ENDOSCOPY     FINGER GANGLION CYST EXCISION Right    HAND SURGERY Right    ring finger - cyst   HEMORRHOID SURGERY  2016   INSERTION OF MESH N/A 09/24/2019   Procedure: INSERTION OF MESH;  Surgeon: Coralie Keens, MD;  Location: [facility];  Service: General;  Laterality: N/A;   KNEE ARTHROSCOPY WITH MENISCAL REPAIR Left 02/01/2016   Procedure: KNEE ARTHROSCOPY WITH MENISCAL ROOT REPAIR VS DEBRIDEMENT;  Surgeon: Meredith Pel, MD;  Location: [facility];  Service: Orthopedics;  Laterality: Left;   MOUTH SURGERY     wisdom teeth removed   ORIF CLAVICLE FRACTURE Right 07/14/2015   ORIF CLAVICULAR FRACTURE Right 07/14/2015   Procedure: OPEN REDUCTION INTERNAL FIXATION (ORIF) RIGHT CLAVICLE FRACTURE, RIGHT CARPAL TUNNEL RELEASE;  Surgeon: Meredith Pel, MD;  Location: [facility];  Service: Orthopedics;  Laterality: Right;   SIGMOIDOSCOPY     TONSILLECTOMY      UMBILICAL HERNIA REPAIR N/A 09/24/2019   Procedure: UMBILICAL HERNIA REPAIR WITH MESH;  Surgeon: Coralie Keens, MD;  Location: [facility];  Service: General;  Laterality: N/A;   Social History   Occupational History   Occupation: Retired-police  Tobacco Use   Smoking status: Never   Smokeless tobacco: Never  Vaping Use   Vaping Use: Never used  Substance and Sexual Activity   Alcohol use: Yes    Alcohol/week: 7.0 standard drinks    Types: 3 Glasses of wine, 3 Cans of beer, 1 Shots of liquor per week    Comment: occassional   Drug use: No   Sexual activity: Not on file

## 2021-03-03 ENCOUNTER — Other Ambulatory Visit: Payer: Self-pay | Admitting: Surgical

## 2021-03-03 DIAGNOSIS — M170 Bilateral primary osteoarthritis of knee: Secondary | ICD-10-CM

## 2021-03-03 NOTE — Telephone Encounter (Signed)
Needs repeat check on kidney function

## 2021-03-25 ENCOUNTER — Telehealth: Payer: Self-pay

## 2021-03-25 NOTE — Telephone Encounter (Signed)
Patient wanting to get renewal on handicap placard. Please advise.

## 2021-03-26 NOTE — Telephone Encounter (Signed)
Done. Placed up front for patient to pick up

## 2021-03-26 NOTE — Telephone Encounter (Signed)
Okay to renew this for permanent status.  Thanks

## 2021-05-11 ENCOUNTER — Ambulatory Visit (INDEPENDENT_AMBULATORY_CARE_PROVIDER_SITE_OTHER): Payer: 59 | Admitting: Orthopedic Surgery

## 2021-05-11 ENCOUNTER — Ambulatory Visit: Payer: Self-pay

## 2021-05-11 ENCOUNTER — Other Ambulatory Visit: Payer: Self-pay

## 2021-05-11 ENCOUNTER — Encounter: Payer: Self-pay | Admitting: Orthopedic Surgery

## 2021-05-11 VITALS — Ht 69.5 in | Wt 377.0 lb

## 2021-05-11 DIAGNOSIS — M25561 Pain in right knee: Secondary | ICD-10-CM | POA: Diagnosis not present

## 2021-05-11 DIAGNOSIS — M170 Bilateral primary osteoarthritis of knee: Secondary | ICD-10-CM | POA: Diagnosis not present

## 2021-05-11 DIAGNOSIS — G8929 Other chronic pain: Secondary | ICD-10-CM | POA: Diagnosis not present

## 2021-05-11 NOTE — Progress Notes (Signed)
Office Visit Note   Patient: Adam Stephenson           Date of Birth: 18-Apr-1965           MRN: [id number] Visit Date: 05/11/2021 Requested by: Shirline Frees, MD [address],  [place] [address] PCP: Shirline Frees, MD  Subjective: No chief complaint on file.   HPI: [name] is a 56 year old patient with bilateral knee pain right worse than left.  The right knee pain has been worse for the past 6 weeks.  He has good and bad days.  Generally though the knee is always painful particularly with ambulation.  The pain will occasionally wake him from sleep at night.  Denies any discrete recent history of injury.  He has fallen several times.  Takes tramadol and Robaxin.  He is using a cane.  He is able to walk once he gets moving.  Reports level 4 out of 10 pain as a baseline but typically goes up to 8 out of 10 by the afternoon.  Describes burning type pain in the back of his knee.  Does not report much in the way of back pain or groin pain.              ROS: All systems reviewed are negative as they relate to the chief complaint within the history of present illness.  Patient denies  fevers or chills.   Assessment & Plan: Visit Diagnoses:  1. Primary osteoarthritis of both knees   2. Chronic pain of right knee     Plan: Impression is severe bilateral knee arthritis worse in the medial compartments with 6 there is slight increase in varus alignment compared to 2 years ago.  The arthritis has worsened.  Plan is weight loss to go from BMI 54 down to just below 40.  That will require about a 100 pound weight loss.  We talked about injection today but that really only lasts him 1 to 2 days.  No effusion in the right knee so I do not think that would necessarily be helpful.  Interestingly the left knee is less symptomatic which is the knee that had the meniscal root repair performed.  Radiographically both knees have the same amount of arthritis but the right one is much more  symptomatic.  Encouraged him to take vitamin D as well has this could be stress related in addition to arthritic.  Follow-Up Instructions: Return if symptoms worsen or fail to improve.   Orders:  Orders Placed This Encounter  Procedures   XR KNEE 3 VIEW RIGHT   No orders of the defined types were placed in this encounter.     Procedures: No procedures performed   Clinical Data: No additional findings.  Objective: Vital Signs: Ht 5' 9.5" (1.765 m)    Wt (!) 377 lb (171 kg)    BMI 54.87 kg/m   Physical Exam:   Constitutional: Patient appears well-developed HEENT:  Head: Normocephalic Eyes:EOM are normal Neck: Normal range of motion Cardiovascular: Normal rate Pulmonary/chest: Effort normal Neurologic: Patient is alert Skin: Skin is warm Psychiatric: Patient has normal mood and affect   Ortho Exam: Ortho exam demonstrates good range of motion of both knees from full extension to about 100 of flexion.  No effusion in either knee.  Extensor mechanism intact.  Medial greater than lateral joint line tenderness.  Pedal pulses palpable.  No groin pain with internal or external rotation of either leg.  I do not  palpate a Baker's cyst in the back of either knee.  Specialty Comments:  No specialty comments available.  Imaging: XR KNEE 3 VIEW RIGHT  Result Date: 05/11/2021 AP lateral merchant radiographs right knee reviewed.  Severe end-stage tricompartmental arthritis is present with slight varus alignment and most severe arthritis present in the medial compartment.  No acute fracture.  Patella height normal relative to distal femur.    PMFS History: Patient Active Problem List   Diagnosis Date Noted   Pain of right heel 01/12/2017   Other tear of medial meniscus, current injury, left knee, subsequent encounter 03/02/2016   Clavicle fracture 07/14/2015   Past Medical History:  Diagnosis Date   Anxiety    Complication of anesthesia     2007- Colonoscopy had to be  stopped due not breathing, 2016- at [facility]- BP pressure bottomed   Depression    GERD (gastroesophageal reflux disease)    Headache    History of kidney stones    x2  passed   Hyperlipidemia    Hypertension    Sleep apnea    USES CPAP   TBI (traumatic brain injury) 2002   Tinea versicolor    arm   Umbilical hernia     Family History  Problem Relation Age of Onset   CAD Father    CAD Paternal Grandfather     Past Surgical History:  Procedure Laterality Date   ADENOIDECTOMY     CARPAL TUNNEL RELEASE Right 07/14/2015   Procedure: CARPAL TUNNEL RELEASE;  Surgeon: Meredith Pel, MD;  Location: [facility];  Service: Orthopedics;  Laterality: Right;   carpel tunnel left Left    CHOLECYSTECTOMY  1994   CLAVICLE HARDWARE REMOVAL Left 03/2009   CLAVICLE SURGERY Left 07/2008   COLONOSCOPY     polyp   ESOPHAGOGASTRODUODENOSCOPY ENDOSCOPY     FINGER GANGLION CYST EXCISION Right    HAND SURGERY Right    ring finger - cyst   HEMORRHOID SURGERY  2016   INSERTION OF MESH N/A 09/24/2019   Procedure: INSERTION OF MESH;  Surgeon: Coralie Keens, MD;  Location: [facility];  Service: General;  Laterality: N/A;   KNEE ARTHROSCOPY WITH MENISCAL REPAIR Left 02/01/2016   Procedure: KNEE ARTHROSCOPY WITH MENISCAL ROOT REPAIR VS DEBRIDEMENT;  Surgeon: Meredith Pel, MD;  Location: [facility];  Service: Orthopedics;  Laterality: Left;   MOUTH SURGERY     wisdom teeth removed   ORIF CLAVICLE FRACTURE Right 07/14/2015   ORIF CLAVICULAR FRACTURE Right 07/14/2015   Procedure: OPEN REDUCTION INTERNAL FIXATION (ORIF) RIGHT CLAVICLE FRACTURE, RIGHT CARPAL TUNNEL RELEASE;  Surgeon: Meredith Pel, MD;  Location: [facility];  Service: Orthopedics;  Laterality: Right;   SIGMOIDOSCOPY     TONSILLECTOMY     UMBILICAL HERNIA REPAIR N/A 09/24/2019   Procedure: UMBILICAL HERNIA REPAIR WITH MESH;  Surgeon: Coralie Keens, MD;  Location: [facility];  Service: General;  Laterality:  N/A;   Social History   Occupational History   Occupation: Retired-police  Tobacco Use   Smoking status: Never   Smokeless tobacco: Never  Vaping Use   Vaping Use: Never used  Substance and Sexual Activity   Alcohol use: Yes    Alcohol/week: 7.0 standard drinks    Types: 3 Glasses of wine, 3 Cans of beer, 1 Shots of liquor per week    Comment: occassional   Drug use: No   Sexual activity: Not on file

## 2021-10-13 ENCOUNTER — Telehealth: Payer: Self-pay

## 2021-10-13 NOTE — Telephone Encounter (Signed)
Patients significant other called requesting an updated work note with restriction. Note generated based off of our conversation.

## 2021-10-14 NOTE — Telephone Encounter (Signed)
Last note was from 2021.  All notes were reviewed.  Patient did have some type of knee injury in 2021 and we kept him on modified duty for 8 weeks after that.  Currently the patient has severe end-stage arthritis which will limit his standing and walking endurance.  Not sure if his current job will be able to accommodate restrictions.  Nonetheless note is provided with suggested accommodations due to his current condition

## 2022-01-03 ENCOUNTER — Telehealth: Payer: Self-pay | Admitting: Podiatry

## 2022-01-03 NOTE — Telephone Encounter (Signed)
Received refill request for nail fungus treatment from [facility]. He has not been seen since 2020. Will need an appt.

## 2022-01-14 ENCOUNTER — Ambulatory Visit: Payer: 59 | Admitting: Surgical

## 2022-01-18 ENCOUNTER — Ambulatory Visit: Payer: 59 | Admitting: Podiatry

## 2022-01-18 DIAGNOSIS — B353 Tinea pedis: Secondary | ICD-10-CM

## 2022-01-18 DIAGNOSIS — B351 Tinea unguium: Secondary | ICD-10-CM

## 2022-01-18 MED ORDER — CLOTRIMAZOLE-BETAMETHASONE 1-0.05 % EX CREA: 1.0000 | TOPICAL_CREAM | Freq: Two times a day (BID) | CUTANEOUS | 0 refills | Status: AC

## 2022-01-18 NOTE — Patient Instructions (Signed)
I have ordered a medication for you that will come from [facility] in [place]. They should be calling you to verify insurance and will mail the medication to you. If you live close by then you can go by their pharmacy to pick up the medication. Their phone number is [phone]. If you do not hear from them in the next few days, please give us a call at [phone].   

## 2022-01-18 NOTE — Progress Notes (Signed)
Subjective:   Patient ID: Adam Stephenson, male   DOB: 56 y.o.   MRN: [id number]   HPI Chief Complaint  Patient presents with   Nail Problem    Nail fungus, medication refill    56 year old male presents For Follow-Up Evaluation of Nail Fungus.  He Is Asking for Refill of the Topical Medication to [facility] for [facility]. He is overall pleased about the same.  He has no splinter redness or drainage at the toenail sites.  No conditions.   Review of Systems  All other systems reviewed and are negative.  Past Medical History:  Diagnosis Date   Anxiety    Complication of anesthesia     2007- Colonoscopy had to be stopped due not breathing, 2016- at [facility]- BP pressure bottomed   Depression    GERD (gastroesophageal reflux disease)    Headache    History of kidney stones    x2  passed   Hyperlipidemia    Hypertension    Sleep apnea    USES CPAP   TBI (traumatic brain injury) ([place]) 2002   Tinea versicolor    arm   Umbilical hernia     Past Surgical History:  Procedure Laterality Date   ADENOIDECTOMY     CARPAL TUNNEL RELEASE Right 07/14/2015   Procedure: CARPAL TUNNEL RELEASE;  Surgeon: Meredith Pel, MD;  Location: [facility];  Service: Orthopedics;  Laterality: Right;   carpel tunnel left Left    CHOLECYSTECTOMY  1994   CLAVICLE HARDWARE REMOVAL Left 03/2009   CLAVICLE SURGERY Left 07/2008   COLONOSCOPY     polyp   ESOPHAGOGASTRODUODENOSCOPY ENDOSCOPY     FINGER GANGLION CYST EXCISION Right    HAND SURGERY Right    ring finger - cyst   HEMORRHOID SURGERY  2016   INSERTION OF MESH N/A 09/24/2019   Procedure: INSERTION OF MESH;  Surgeon: Coralie Keens, MD;  Location: [facility];  Service: General;  Laterality: N/A;   KNEE ARTHROSCOPY WITH MENISCAL REPAIR Left 02/01/2016   Procedure: KNEE ARTHROSCOPY WITH MENISCAL ROOT REPAIR VS DEBRIDEMENT;  Surgeon: Meredith Pel, MD;  Location: [facility];  Service: Orthopedics;  Laterality: Left;    MOUTH SURGERY     wisdom teeth removed   ORIF CLAVICLE FRACTURE Right 07/14/2015   ORIF CLAVICULAR FRACTURE Right 07/14/2015   Procedure: OPEN REDUCTION INTERNAL FIXATION (ORIF) RIGHT CLAVICLE FRACTURE, RIGHT CARPAL TUNNEL RELEASE;  Surgeon: Meredith Pel, MD;  Location: [facility];  Service: Orthopedics;  Laterality: Right;   SIGMOIDOSCOPY     TONSILLECTOMY     UMBILICAL HERNIA REPAIR N/A 09/24/2019   Procedure: UMBILICAL HERNIA REPAIR WITH MESH;  Surgeon: Coralie Keens, MD;  Location: [facility];  Service: General;  Laterality: N/A;     Current Outpatient Medications:    clotrimazole-betamethasone (LOTRISONE) cream, Apply 1 Application topically 2 (two) times daily., Disp: 30 g, Rfl: 0   DULoxetine (CYMBALTA) 60 MG capsule, Take 1 capsule by mouth daily., Disp: , Rfl:    acetaminophen-codeine (TYLENOL #3) 300-30 MG tablet, Take 1 tablet by mouth every 12 (twelve) hours as needed for moderate pain., Disp: 45 tablet, Rfl: 0   amLODipine (NORVASC) 10 MG tablet, Take 10 mg by mouth daily., Disp: , Rfl:    atorvastatin (LIPITOR) 40 MG tablet, Take 40 mg by mouth every morning. , Disp: , Rfl:    Diclofenac Sodium (PENNSAID) 2 % SOLN, Place 2 Pump onto the skin 2 (two) times daily., Disp:  1 Bottle, Rfl: 1   ketoconazole (NIZORAL) 2 % cream, Apply 1 application topically daily., Disp: 60 g, Rfl: 2   meloxicam (MOBIC) 15 MG tablet, TAKE 1 TABLET BY MOUTH EVERY DAY AS NEEDED FOR PAIN, Disp: 30 tablet, Rfl: 2   olmesartan-hydrochlorothiazide (BENICAR HCT) 40-12.5 MG tablet, Take 1 tablet by mouth daily., Disp: , Rfl:    pantoprazole (PROTONIX) 40 MG injection, 1 tablet, Disp: , Rfl:    pantoprazole (PROTONIX) 40 MG tablet, Take 40 mg by mouth every morning. , Disp: , Rfl:    traMADol (ULTRAM) 50 MG tablet, Take 1 tablet (50 mg total) by mouth every 8 (eight) hours as needed., Disp: 45 tablet, Rfl: 0  Allergies  Allergen Reactions   Atenolol     Other reaction(s): Unknown    Hydrochlorothiazide     Other reaction(s): ED          Objective:  Physical Exam  General: AAO x3, NAD  Dermatological: Nails are hypertrophic, dystrophic, brittle, discolored, elongated 10. No surrounding redness or drainage.  On the fourth interspaces bilaterally there is dry, scaly, erythematous skin consistent with tinea pedis.  Tissue no open lesions or pre-ulcerative lesions are identified today.  Vascular: Dorsalis Pedis artery and Posterior Tibial artery pedal pulses are 2/4 bilateral with immedate capillary fill time.  There is no pain with calf compression, swelling, warmth, erythema.   Neruologic: Grossly intact via light touch bilateral.  Musculoskeletal: No gross boney pedal deformities bilateral. No pain, crepitus, or limitation noted with foot and ankle range of motion bilateral. Muscular strength 5/5 in all groups tested bilateral.  Gait: Unassisted, Nonantalgic.       Assessment:   Onychomycosis, tinea pedis     Plan:  As a courtesy debride the nails without any complications or bleeding.  I reorder the compound cream through [facility] to help with nail fungus.  I also prescribed ketoconazole for the interspace.  Discussed daily foot inspection.  Discussed external measures to prevent reoccurrence as well.  Trula Slade DPM

## 2022-01-21 ENCOUNTER — Ambulatory Visit (INDEPENDENT_AMBULATORY_CARE_PROVIDER_SITE_OTHER): Payer: 59

## 2022-01-21 ENCOUNTER — Ambulatory Visit: Payer: 59 | Admitting: Orthopedic Surgery

## 2022-01-21 ENCOUNTER — Encounter: Payer: Self-pay | Admitting: Orthopedic Surgery

## 2022-01-21 VITALS — Ht 69.5 in | Wt 390.0 lb

## 2022-01-21 DIAGNOSIS — G8929 Other chronic pain: Secondary | ICD-10-CM | POA: Diagnosis not present

## 2022-01-21 DIAGNOSIS — M170 Bilateral primary osteoarthritis of knee: Secondary | ICD-10-CM

## 2022-01-21 DIAGNOSIS — M25561 Pain in right knee: Secondary | ICD-10-CM

## 2022-01-21 DIAGNOSIS — M1711 Unilateral primary osteoarthritis, right knee: Secondary | ICD-10-CM

## 2022-01-21 DIAGNOSIS — M1712 Unilateral primary osteoarthritis, left knee: Secondary | ICD-10-CM | POA: Diagnosis not present

## 2022-01-21 NOTE — Progress Notes (Unsigned)
Office Visit Note   Patient: Adam Stephenson           Date of Birth: Aug 18, 1965           MRN: [id number] Visit Date: 01/21/2022 Requested by: Shirline Frees, MD [address] [place],  [place] [address] PCP: Shirline Frees, MD  Subjective: Chief Complaint  Patient presents with   Right Knee - Pain    HPI: Adam Stephenson is a 56 y.o. male who presents to the office reporting right knee pain.  He describes right knee pain and definite locking symptoms on the lateral aspect of his right knee.  Essentially hurts him every time he goes from sitting to standing.  This is intermittent and happens on every occasion from sitting to standing.  Definitely affecting the lateral side only and not the medial side.  He does have a known history of severe end-stage arthritis in both knees worse in the medial compartments.  Denies any instability but does describe definite locking..                ROS: All systems reviewed are negative as they relate to the chief complaint within the history of present illness.  Patient denies fevers or chills.  Assessment & Plan: Visit Diagnoses:  1. Primary osteoarthritis of both knees     Plan: Impression is possible mechanical meniscal tear or loose body in the lateral compartment preventing him from getting up from a seated position and effectively decreasing his already limited mobility.  Aspiration injection performed today.  Recommend MRI scan of that right knee to evaluate the lateral compartment for definitive arthroscopically treatable pathology prior to any intervention.  I think in Bruce's case he does have some significant arthritis in that lateral compartment but if this source of mechanical locking can be identified we can have a discussion at that time as to whether or not debridement arthroscopically is indicated.  Follow-up after the scan.  Follow-Up Instructions: No follow-ups on file.   Orders:  No orders of the defined types were  placed in this encounter.  No orders of the defined types were placed in this encounter.     Procedures: Large Joint Inj: R knee on 01/21/2022 10:14 AM Indications: diagnostic evaluation, joint swelling and pain Details: 18 G 1.5 in needle, superolateral approach  Arthrogram: No  Medications: 5 mL lidocaine 1 %; 40 mg methylPREDNISolone acetate 40 MG/ML; 4 mL bupivacaine 0.25 % Outcome: tolerated well, no immediate complications Procedure, treatment alternatives, risks and benefits explained, specific risks discussed. Consent was given by the patient. Immediately prior to procedure a time out was called to verify the correct patient, procedure, equipment, support staff and site/side marked as required. Patient was prepped and draped in the usual sterile fashion.       Clinical Data: No additional findings.  Objective: Vital Signs: Ht 5' 9.5" (1.765 m)   Wt (!) 390 lb (176.9 kg)   BMI 56.77 kg/m   Physical Exam:  Constitutional: Patient appears well-developed HEENT:  Head: Normocephalic Eyes:EOM are normal Neck: Normal range of motion Cardiovascular: Normal rate Pulmonary/chest: Effort normal Neurologic: Patient is alert Skin: Skin is warm Psychiatric: Patient has normal mood and affect  Ortho Exam: Ortho exam demonstrates full active and passive range of motion of the ankles.  Right knee lacks about 5 degrees of flexion and bends to about 105.  Collaterals are stable.  He does have a mild effusion in the right knee which is  new compared to the left.  No effusion.  Specialty Comments:  No specialty comments available.  Imaging: No results found.   PMFS History: Patient Active Problem List   Diagnosis Date Noted   Pain of right heel 01/12/2017   Other tear of medial meniscus, current injury, left knee, subsequent encounter 03/02/2016   Clavicle fracture 07/14/2015   Past Medical History:  Diagnosis Date   Anxiety    Complication of anesthesia     2007-  Colonoscopy had to be stopped due not breathing, 2016- at [facility]- BP pressure bottomed   Depression    GERD (gastroesophageal reflux disease)    Headache    History of kidney stones    x2  passed   Hyperlipidemia    Hypertension    Sleep apnea    USES CPAP   TBI (traumatic brain injury) ([place]) 2002   Tinea versicolor    arm   Umbilical hernia     Family History  Problem Relation Age of Onset   CAD Father    CAD Paternal Grandfather     Past Surgical History:  Procedure Laterality Date   ADENOIDECTOMY     CARPAL TUNNEL RELEASE Right 07/14/2015   Procedure: CARPAL TUNNEL RELEASE;  Surgeon: Meredith Pel, MD;  Location: [facility];  Service: Orthopedics;  Laterality: Right;   carpel tunnel left Left    CHOLECYSTECTOMY  1994   CLAVICLE HARDWARE REMOVAL Left 03/2009   CLAVICLE SURGERY Left 07/2008   COLONOSCOPY     polyp   ESOPHAGOGASTRODUODENOSCOPY ENDOSCOPY     FINGER GANGLION CYST EXCISION Right    HAND SURGERY Right    ring finger - cyst   HEMORRHOID SURGERY  2016   INSERTION OF MESH N/A 09/24/2019   Procedure: INSERTION OF MESH;  Surgeon: Coralie Keens, MD;  Location: [facility];  Service: General;  Laterality: N/A;   KNEE ARTHROSCOPY WITH MENISCAL REPAIR Left 02/01/2016   Procedure: KNEE ARTHROSCOPY WITH MENISCAL ROOT REPAIR VS DEBRIDEMENT;  Surgeon: Meredith Pel, MD;  Location: [facility];  Service: Orthopedics;  Laterality: Left;   MOUTH SURGERY     wisdom teeth removed   ORIF CLAVICLE FRACTURE Right 07/14/2015   ORIF CLAVICULAR FRACTURE Right 07/14/2015   Procedure: OPEN REDUCTION INTERNAL FIXATION (ORIF) RIGHT CLAVICLE FRACTURE, RIGHT CARPAL TUNNEL RELEASE;  Surgeon: Meredith Pel, MD;  Location: [facility];  Service: Orthopedics;  Laterality: Right;   SIGMOIDOSCOPY     TONSILLECTOMY     UMBILICAL HERNIA REPAIR N/A 09/24/2019   Procedure: UMBILICAL HERNIA REPAIR WITH MESH;  Surgeon: Coralie Keens, MD;  Location: [facility];   Service: General;  Laterality: N/A;   Social History   Occupational History   Occupation: Retired-police  Tobacco Use   Smoking status: Never   Smokeless tobacco: Never  Vaping Use   Vaping Use: Never used  Substance and Sexual Activity   Alcohol use: Yes    Alcohol/week: 7.0 standard drinks of alcohol    Types: 3 Glasses of wine, 3 Cans of beer, 1 Shots of liquor per week    Comment: occassional   Drug use: No   Sexual activity: Not on file

## 2022-01-23 ENCOUNTER — Ambulatory Visit
Admission: RE | Admit: 2022-01-23 | Discharge: 2022-01-23 | Disposition: A | Discharge status: Home / Self Care | Payer: 59 | Source: Ambulatory Visit | Attending: Orthopedic Surgery | Admitting: Orthopedic Surgery

## 2022-01-23 DIAGNOSIS — G8929 Other chronic pain: Secondary | ICD-10-CM

## 2022-01-23 DIAGNOSIS — M170 Bilateral primary osteoarthritis of knee: Secondary | ICD-10-CM

## 2022-01-24 MED ORDER — BUPIVACAINE HCL 0.25 % IJ SOLN
4.0000 mL | INTRAMUSCULAR | Status: AC | PRN
  Administered 2022-01-21: 4 mL via INTRA_ARTICULAR

## 2022-01-24 MED ORDER — METHYLPREDNISOLONE ACETATE 40 MG/ML IJ SUSP
40.0000 mg | INTRAMUSCULAR | Status: AC | PRN
  Administered 2022-01-21: 40 mg via INTRA_ARTICULAR

## 2022-01-24 MED ORDER — LIDOCAINE HCL 1 % IJ SOLN
5.0000 mL | INTRAMUSCULAR | Status: AC | PRN
  Administered 2022-01-21: 5 mL

## 2022-01-31 NOTE — Progress Notes (Signed)
He is feeling better with the injection.  No need for appointment on the 29th.  We will try episodic injections moving forward.  I discussed with him that there is nothing arthroscopically treatable in the lateral compartment of the knee.

## 2022-02-16 ENCOUNTER — Ambulatory Visit: Payer: 59 | Admitting: Orthopedic Surgery

## 2022-04-08 ENCOUNTER — Ambulatory Visit: Payer: 59 | Admitting: Surgical

## 2022-04-08 ENCOUNTER — Ambulatory Visit (INDEPENDENT_AMBULATORY_CARE_PROVIDER_SITE_OTHER): Payer: 59

## 2022-04-08 DIAGNOSIS — M1711 Unilateral primary osteoarthritis, right knee: Secondary | ICD-10-CM

## 2022-04-08 DIAGNOSIS — M170 Bilateral primary osteoarthritis of knee: Secondary | ICD-10-CM

## 2022-04-10 ENCOUNTER — Encounter: Payer: Self-pay | Admitting: Surgical

## 2022-04-10 MED ORDER — METHYLPREDNISOLONE ACETATE 40 MG/ML IJ SUSP
40.0000 mg | INTRAMUSCULAR | Status: AC | PRN
  Administered 2022-04-08: 40 mg via INTRA_ARTICULAR

## 2022-04-10 MED ORDER — BUPIVACAINE HCL 0.25 % IJ SOLN
4.0000 mL | INTRAMUSCULAR | Status: AC | PRN
  Administered 2022-04-08: 4 mL via INTRA_ARTICULAR

## 2022-04-10 MED ORDER — LIDOCAINE HCL 1 % IJ SOLN
5.0000 mL | INTRAMUSCULAR | Status: AC | PRN
  Administered 2022-04-08: 5 mL

## 2022-04-10 NOTE — Progress Notes (Addendum)
Office Visit Note   Patient: Adam Stephenson           Date of Birth: 02/14/1966           MRN: [id number] Visit Date: 04/08/2022 Requested by: Shirline Frees, MD [address] [place],  [place] [address] PCP: Shirline Frees, MD  Subjective: Chief Complaint  Patient presents with   Right Knee - Pain    HPI: Adam Stephenson is a 57 y.o. male who presents to the office reporting fall with increased right knee pain.  Patient states that he was descending stairs when he lost his balance because his right knee buckled on him.  He builds up a fair amount of momentum before ultimately falling and striking the anterior aspect of both of his knees while he was falling forward.  He has had increased pain since his fall in both knees but primarily the right knee.  He has not had any inability to bear weight during this time and he states that each day as he gets further out from the injury, his knees feel better.  He is almost back to his baseline at this point.  He does have a history of right sided groin pain but this is not new for him in any regard.  No left-sided groin pain.  No radicular pain currently.  He has not noticed any new swelling or bruising..                ROS: All systems reviewed are negative as they relate to the chief complaint within the history of present illness.  Patient denies fevers or chills.  Assessment & Plan: Visit Diagnoses:  1. Primary osteoarthritis of both knees     Plan: Patient is a 57 year old male who presents for evaluation of bilateral knee pain, right greater than left.  Sustained fall on Monday.  Radiographs taken today demonstrate severe osteoarthritis but no fracture.  He has very slightly increased laxity of posterior drawer sign of the right knee compared to the left but no effusion so doubt that this is acute.  His knee pain is approaching back to his baseline at this point but offered injection as it has been almost 3 months since his last  injection.  He would like to proceed with this.  Tolerated procedure well.  He is getting married on Sunday and wants to feel as good as possible.  Plan for him to follow-up with the office as needed if he has continued symptoms or lack of sustained relief from injection.  Patient agreed with plan.  Follow-Up Instructions: No follow-ups on file.   Orders:  Orders Placed This Encounter  Procedures   XR KNEE 3 VIEW RIGHT   No orders of the defined types were placed in this encounter.     Procedures: Large Joint Inj: R knee on 04/08/2022 12:22 PM Indications: diagnostic evaluation, joint swelling and pain Details: 18 G 1.5 in needle, superolateral approach  Arthrogram: No  Medications: 5 mL lidocaine 1 %; 40 mg methylPREDNISolone acetate 40 MG/ML; 4 mL bupivacaine 0.25 % Outcome: tolerated well, no immediate complications Procedure, treatment alternatives, risks and benefits explained, specific risks discussed. Consent was given by the patient. Immediately prior to procedure a time out was called to verify the correct patient, procedure, equipment, support staff and site/side marked as required. Patient was prepped and draped in the usual sterile fashion.       Clinical Data: No additional findings.  Objective: Vital  Signs: There were no vitals taken for this visit.  Physical Exam:  Constitutional: Patient appears well-developed HEENT:  Head: Normocephalic Eyes:EOM are normal Neck: Normal range of motion Cardiovascular: Normal rate Pulmonary/chest: Effort normal Neurologic: Patient is alert Skin: Skin is warm Psychiatric: Patient has normal mood and affect  Ortho Exam: Ortho exam demonstrates right knee with no significant effusion.  Left knee with no significant effusion.  No ecchymosis noted in either leg.  He has tenderness over the medial and lateral joint lines moderately of the right knee and mildly of the left knee.  Does have small abrasion noted in both knees over  the anterior aspect of the proximal tibia.  Stable to anterior drawer sign bilaterally.  Stable to varus and valgus stressing at 0 and 30 degrees bilaterally.  Able to perform straight leg raise without extensor lag bilaterally.  He does have some increased laxity with posterior drawer in the right knee relative to the left.  He has decreased hip range of motion on the right compared with the left with reproduced groin pain with FADIR sign and Stinchfield sign.  States that this is very mild and not out of the ordinary for groin pain that he has been experiencing before this fall.  Specialty Comments:  No specialty comments available.  Imaging: No results found.   PMFS History: Patient Active Problem List   Diagnosis Date Noted   Pain of right heel 01/12/2017   Other tear of medial meniscus, current injury, left knee, subsequent encounter 03/02/2016   Clavicle fracture 07/14/2015   Past Medical History:  Diagnosis Date   Anxiety    Complication of anesthesia     2007- Colonoscopy had to be stopped due not breathing, 2016- at [facility]- BP pressure bottomed   Depression    GERD (gastroesophageal reflux disease)    Headache    History of kidney stones    x2  passed   Hyperlipidemia    Hypertension    Sleep apnea    USES CPAP   TBI (traumatic brain injury) ([place]) 2002   Tinea versicolor    arm   Umbilical hernia     Family History  Problem Relation Age of Onset   CAD Father    CAD Paternal Grandfather     Past Surgical History:  Procedure Laterality Date   ADENOIDECTOMY     CARPAL TUNNEL RELEASE Right 07/14/2015   Procedure: CARPAL TUNNEL RELEASE;  Surgeon: Meredith Pel, MD;  Location: [facility];  Service: Orthopedics;  Laterality: Right;   carpel tunnel left Left    CHOLECYSTECTOMY  1994   CLAVICLE HARDWARE REMOVAL Left 03/2009   CLAVICLE SURGERY Left 07/2008   COLONOSCOPY     polyp   ESOPHAGOGASTRODUODENOSCOPY ENDOSCOPY     FINGER GANGLION CYST EXCISION Right     HAND SURGERY Right    ring finger - cyst   HEMORRHOID SURGERY  2016   INSERTION OF MESH N/A 09/24/2019   Procedure: INSERTION OF MESH;  Surgeon: Coralie Keens, MD;  Location: [facility];  Service: General;  Laterality: N/A;   KNEE ARTHROSCOPY WITH MENISCAL REPAIR Left 02/01/2016   Procedure: KNEE ARTHROSCOPY WITH MENISCAL ROOT REPAIR VS DEBRIDEMENT;  Surgeon: Meredith Pel, MD;  Location: [facility];  Service: Orthopedics;  Laterality: Left;   MOUTH SURGERY     wisdom teeth removed   ORIF CLAVICLE FRACTURE Right 07/14/2015   ORIF CLAVICULAR FRACTURE Right 07/14/2015   Procedure: OPEN REDUCTION INTERNAL FIXATION (ORIF) RIGHT CLAVICLE  FRACTURE, RIGHT CARPAL TUNNEL RELEASE;  Surgeon: Meredith Pel, MD;  Location: [facility];  Service: Orthopedics;  Laterality: Right;   SIGMOIDOSCOPY     TONSILLECTOMY     UMBILICAL HERNIA REPAIR N/A 09/24/2019   Procedure: UMBILICAL HERNIA REPAIR WITH MESH;  Surgeon: Coralie Keens, MD;  Location: [facility];  Service: General;  Laterality: N/A;   Social History   Occupational History   Occupation: Retired-police  Tobacco Use   Smoking status: Never   Smokeless tobacco: Never  Vaping Use   Vaping Use: Never used  Substance and Sexual Activity   Alcohol use: Yes    Alcohol/week: 7.0 standard drinks of alcohol    Types: 3 Glasses of wine, 3 Cans of beer, 1 Shots of liquor per week    Comment: occassional   Drug use: No   Sexual activity: Not on file

## 2022-05-27 ENCOUNTER — Ambulatory Visit (INDEPENDENT_AMBULATORY_CARE_PROVIDER_SITE_OTHER): Payer: 59

## 2022-05-27 ENCOUNTER — Ambulatory Visit: Payer: 59 | Admitting: Surgical

## 2022-05-27 ENCOUNTER — Encounter: Payer: Self-pay | Admitting: Surgical

## 2022-05-27 DIAGNOSIS — M25551 Pain in right hip: Secondary | ICD-10-CM | POA: Diagnosis not present

## 2022-05-27 DIAGNOSIS — M79641 Pain in right hand: Secondary | ICD-10-CM

## 2022-05-27 DIAGNOSIS — M25561 Pain in right knee: Secondary | ICD-10-CM

## 2022-05-27 DIAGNOSIS — M25531 Pain in right wrist: Secondary | ICD-10-CM

## 2022-05-28 ENCOUNTER — Encounter: Payer: Self-pay | Admitting: Surgical

## 2022-05-28 NOTE — Progress Notes (Signed)
Office Visit Note   Patient: Adam Stephenson           Date of Birth: 1966-02-07           MRN: [id number] Visit Date: 05/27/2022 Requested by: Shirline Frees, [address] [place],  [place] [address] PCP: Shirline Frees, MD  Subjective: Chief Complaint  Patient presents with   Right Knee - Pain   Right Hand - Pain    HPI: Adam Stephenson is a 57 y.o. male who presents to the office reporting increased right knee pain and right wrist pain since falling.  [place]  He was descending stairs and missed the last 1 or 2 stairs falling on his outstretched hands and skinning the anterior lateral aspect of his right knee.  He has history of severe right knee arthritis and states that the pain is slightly worse but he is able to bear weight and since Tuesday, his knee pain has mostly returned to its baseline.  No new bruising on the knee.  He does report the right wrist is bothering him a lot more than the right knee.  He has never really had prior issue with this right wrist but he has had prior carpal tunnel release by Dr. Marlou Sa as well as a cyst removal from one of his fingers on this hand.  Did not have any wrist pain prior to the fall.  Currently he localizes most of his pain to the palmar aspect of the wrist at the base of the palm.  He has difficulty with opening a door, grasping objects, opening a bottle.  It is difficult to make a full fist for him.  Pain is waking him up at night.  He has not noticed any bruising but he states that he does not really have a bruise with any injury..                ROS: All systems reviewed are negative as they relate to the chief complaint within the history of present illness.  Patient denies fevers or chills.  Assessment & Plan: Visit Diagnoses:  1. Pain in right hand   2. Acute pain of right knee   3. Pain in right hip   4. Pain in right wrist     Plan: Patient is a 57 year old male who presents for  evaluation of right wrist pain primarily.  He has wrist pain since a fall several days ago.  Pain has actually worsened in this timeframe rather than gotten better.  No real history of significant wrist pain aside from carpal tunnel syndrome which she had addressed surgically several years ago by Dr. Marlou Sa.  He has significant limitation with ADLs and his dominant hand now.  He has severely limited range of motion of the right wrist relative to the left.  Radiographs taken today demonstrate moderate first CMC and STT joint arthritis but no evidence of new fracture or dislocation.  With negative radiographs, plan for further evaluation of occult fracture versus ligamentous injury with MRI of the right wrist.  He will use wrist brace that he has at home and avoid lifting in the meantime.  May just be exacerbation of his existing arthritis but this seems like a distinct change in someone who has not had any significant chronic wrist pain.  Follow-up after MRI to review results.  Additionally, patient has increased groin pain over the last several months that he has  noticed, particularly with hip flexion activities.  He has no worsening of his groin pain from his fall.  We obtained radiographs demonstrating mild to moderate degenerative changes of the right hip joint.  Discussed options such as cortisone injection under ultrasound but he would like to hold off on any intervention until his groin pain gets worse.  Follow-Up Instructions: No follow-ups on file.   Orders:  Orders Placed This Encounter  Procedures   XR KNEE 3 VIEW RIGHT   XR Hand Complete Right   XR HIP UNILAT W OR W/O PELVIS 2-3 VIEWS RIGHT   MR Wrist Right w/o contrast   No orders of the defined types were placed in this encounter.     Procedures: No procedures performed   Clinical Data: No additional findings.  Objective: Vital Signs: There were no vitals taken for this visit.  Physical Exam:  Constitutional: Patient appears  well-developed HEENT:  Head: Normocephalic Eyes:EOM are normal Neck: Normal range of motion Cardiovascular: Normal rate Pulmonary/chest: Effort normal Neurologic: Patient is alert Skin: Skin is warm Psychiatric: Patient has normal mood and affect  Ortho Exam: Ortho exam demonstrates left wrist with 70 degrees extension and 60 degrees of wrist flexion.  This is compared with the right wrist with 45 degrees of wrist flexion and 40 degrees of wrist extension with increased pain in both range of motion's.  Pronation and supination of the right wrist is equivalent to the left.  There is no observable ecchymosis.  He does have tenderness over the anatomic snuffbox, scaphoid tubercle, pisiform, 3-4 portal.  He is able to make a fist though he winces in pain and he has reduced grip strength compared to contralateral hand.  He has intact EPL, FPL, finger abduction.  Right knee with no significant effusion.  He has mild tenderness over the medial lateral joint lines.  No ecchymosis noted.  Intact extensor mechanism is able to perform straight leg raise without extensor lag.  Positive FADIR sign.  Positive Stinchfield sign.  He is ambulatory and is able to bear weight without difficulty or any significant antalgia.  Specialty Comments:  No specialty comments available.  Imaging: No results found.   PMFS History: Patient Active Problem List   Diagnosis Date Noted   Pain of right heel 01/12/2017   Other tear of medial meniscus, current injury, left knee, subsequent encounter 03/02/2016   Clavicle fracture 07/14/2015   Past Medical History:  Diagnosis Date   Anxiety    Complication of anesthesia     2007- Colonoscopy had to be stopped due not breathing, 2016- at [facility]- BP pressure bottomed   Depression    GERD (gastroesophageal reflux disease)    Headache    History of kidney stones    x2  passed   Hyperlipidemia    Hypertension    Sleep apnea    USES CPAP   TBI (traumatic brain  injury) ([facility]) 2002   Tinea versicolor    arm   Umbilical hernia     Family History  Problem Relation Age of Onset   CAD Father    CAD Paternal Grandfather     Past Surgical History:  Procedure Laterality Date   ADENOIDECTOMY     CARPAL TUNNEL RELEASE Right 07/14/2015   Procedure: CARPAL TUNNEL RELEASE;  Surgeon: Meredith Pel, MD;  Location: [facility];  Service: Orthopedics;  Laterality: Right;   carpel tunnel left Left    CHOLECYSTECTOMY  1994   CLAVICLE HARDWARE REMOVAL Left 03/2009  CLAVICLE SURGERY Left 07/2008   COLONOSCOPY     polyp   ESOPHAGOGASTRODUODENOSCOPY ENDOSCOPY     FINGER GANGLION CYST EXCISION Right    HAND SURGERY Right    ring finger - cyst   HEMORRHOID SURGERY  2016   INSERTION OF MESH N/A 09/24/2019   Procedure: INSERTION OF MESH;  Surgeon: Coralie Keens, MD;  Location: [facility];  Service: General;  Laterality: N/A;   KNEE ARTHROSCOPY WITH MENISCAL REPAIR Left 02/01/2016   Procedure: KNEE ARTHROSCOPY WITH MENISCAL ROOT REPAIR VS DEBRIDEMENT;  Surgeon: Meredith Pel, MD;  Location: [facility];  Service: Orthopedics;  Laterality: Left;   MOUTH SURGERY     wisdom teeth removed   ORIF CLAVICLE FRACTURE Right 07/14/2015   ORIF CLAVICULAR FRACTURE Right 07/14/2015   Procedure: OPEN REDUCTION INTERNAL FIXATION (ORIF) RIGHT CLAVICLE FRACTURE, RIGHT CARPAL TUNNEL RELEASE;  Surgeon: Meredith Pel, MD;  Location: [facility];  Service: Orthopedics;  Laterality: Right;   SIGMOIDOSCOPY     TONSILLECTOMY     UMBILICAL HERNIA REPAIR N/A 09/24/2019   Procedure: UMBILICAL HERNIA REPAIR WITH MESH;  Surgeon: Coralie Keens, MD;  Location: [facility];  Service: General;  Laterality: N/A;   Social History   Occupational History   Occupation: Retired-police  Tobacco Use   Smoking status: Never   Smokeless tobacco: Never  Vaping Use   Vaping Use: Never used  Substance and Sexual Activity   Alcohol use: Yes    Alcohol/week: 7.0  standard drinks of alcohol    Types: 3 Glasses of wine, 3 Cans of beer, 1 Shots of liquor per week    Comment: occassional   Drug use: No   Sexual activity: Not on file

## 2022-06-22 ENCOUNTER — Ambulatory Visit
Admission: RE | Admit: 2022-06-22 | Discharge: 2022-06-22 | Disposition: A | Discharge status: Home / Self Care | Payer: 59 | Source: Ambulatory Visit | Attending: Surgical | Admitting: Surgical

## 2022-06-22 DIAGNOSIS — M79641 Pain in right hand: Secondary | ICD-10-CM

## 2022-06-22 DIAGNOSIS — M25531 Pain in right wrist: Secondary | ICD-10-CM

## 2022-06-29 ENCOUNTER — Telehealth: Payer: Self-pay

## 2022-06-29 NOTE — Telephone Encounter (Signed)
Franky Macho called with results see below.

## 2022-06-29 NOTE — Progress Notes (Signed)
I called Adam Stephenson.  His pain is overall better and continuing to improve since his fall.  No fracture or ligamentous injury noted on MRI scan.  I offered injection but he would like to forego this and just have it continue to improve without intervention.  He will call us if he needs anything in the meantime

## 2022-06-29 NOTE — Telephone Encounter (Signed)
-----   Message from Julieanne Cotton, PA-C sent at 06/29/2022 11:19 AM EDT ----- I called Adam Stephenson.  His pain is overall better and continuing to improve since his fall.  No fracture or ligamentous injury noted on MRI scan.  I offered injection but he would like to forego this and just have it continue to improve without interventio n.  He will call us if he needs anything in the meantime

## 2022-09-11 ENCOUNTER — Emergency Department (HOSPITAL_BASED_OUTPATIENT_CLINIC_OR_DEPARTMENT_OTHER): Payer: 59 | Admitting: Radiology

## 2022-09-11 ENCOUNTER — Emergency Department (HOSPITAL_BASED_OUTPATIENT_CLINIC_OR_DEPARTMENT_OTHER)
Admission: EM | Admit: 2022-09-11 | Discharge: 2022-09-11 | Disposition: A | Discharge status: Home / Self Care | Payer: 59 | Attending: Emergency Medicine | Admitting: Emergency Medicine

## 2022-09-11 ENCOUNTER — Other Ambulatory Visit: Payer: Self-pay

## 2022-09-11 DIAGNOSIS — Z79899 Other long term (current) drug therapy: Secondary | ICD-10-CM | POA: Insufficient documentation

## 2022-09-11 DIAGNOSIS — S63501A Unspecified sprain of right wrist, initial encounter: Secondary | ICD-10-CM | POA: Insufficient documentation

## 2022-09-11 DIAGNOSIS — W19XXXA Unspecified fall, initial encounter: Secondary | ICD-10-CM | POA: Insufficient documentation

## 2022-09-11 DIAGNOSIS — S60911A Unspecified superficial injury of right wrist, initial encounter: Secondary | ICD-10-CM | POA: Diagnosis present

## 2022-09-11 MED ORDER — HYDROCODONE-ACETAMINOPHEN 5-325 MG PO TABS
1.0000 | ORAL_TABLET | Freq: Once | ORAL | Status: AC
  Administered 2022-09-11: 1 via ORAL
  Filled 2022-09-11: qty 1

## 2022-09-11 NOTE — ED Triage Notes (Signed)
Pt reports fall about an hour ago from the ground after loading a truck, c/o right wrist injury and head injury.

## 2022-09-11 NOTE — ED Provider Notes (Cosign Needed Addendum)
[place] EMERGENCY DEPARTMENT AT [facility] Provider Note   CSN: [id number] Arrival date & time: 09/11/22  1414     History  Chief Complaint  Patient presents with   Adam Stephenson is a 57 y.o. male.  Patient presents to the emergency room complaining of right-sided wrist pain shoulder pain secondary to a fall.  Patient states he was tightening a strap on the back of his truck and the strap came loose, with the patient falling backwards onto an outstretched hand.  He does endorse hitting his head but denies losing consciousness and denies blood thinner usage.  Past medical history significant for bilateral clavicle fractures, obesity  HPI     Home Medications Prior to Admission medications   Medication Sig Start Date End Date Taking? Authorizing Provider  atorvastatin (LIPITOR) 40 MG tablet Take 40 mg by mouth every morning.  12/07/12   [provider]  clotrimazole-betamethasone (LOTRISONE) cream Apply 1 Application topically 2 (two) times daily. 01/18/22   Vivi Barrack, DPM  DULoxetine (CYMBALTA) 60 MG capsule Take 1 capsule by mouth daily. 07/04/21 07/04/22  [provider]  olmesartan-hydrochlorothiazide (BENICAR HCT) 40-12.5 MG tablet Take 1 tablet by mouth daily.    [provider]  pantoprazole (PROTONIX) 40 MG tablet Take 40 mg by mouth every morning.     [provider]      Allergies    Atenolol and Hydrochlorothiazide    Review of Systems   Review of Systems  Physical Exam Updated Vital Signs BP (!) 145/90 (BP Location: Left Arm)   Pulse 86   Temp 98.2 F (36.8 C) (Oral)   Resp 17   Ht 6' (1.829 m)   Wt (!) 181.4 kg   SpO2 98%   BMI 54.25 kg/m  Physical Exam Vitals and nursing note reviewed.  HENT:     Head: Normocephalic and atraumatic.  Eyes:     Pupils: Pupils are equal, round, and reactive to light.  Pulmonary:     Effort: Pulmonary effort is normal. No respiratory distress.   Musculoskeletal:        General: No signs of injury. Normal range of motion.     Cervical back: Normal range of motion.     Comments: Patient with snuff box tenderness right hand/wrist. Normal range of motion of the right wrist, hand, and shoulder. Patient complains of mild pain with passive ROM right shoulder.   Skin:    General: Skin is dry.  Neurological:     Mental Status: He is alert.  Psychiatric:        Speech: Speech normal.        Behavior: Behavior normal.     ED Results / Procedures / Treatments   Labs (all labs ordered are listed, but only abnormal results are displayed) Labs Reviewed - No data to display  EKG None  Radiology DG Shoulder Right  Result Date: 09/11/2022 CLINICAL DATA:  Fall, pain EXAM: RIGHT SHOULDER - 2+ VIEW COMPARISON:  None Available. FINDINGS: No acute fracture or dislocation. Plate and screw fixation of the distal clavicle. Severe glenohumeral arthrosis with large inferiorly directed osteophytes. Moderate acromioclavicular arthrosis. Soft tissues unremarkable. IMPRESSION: 1. No acute fracture or dislocation of the right shoulder. 2. Severe glenohumeral arthrosis with large inferiorly directed osteophytes. Moderate acromioclavicular arthrosis. 3. Plate and screw fixation of the distal clavicle. Electronically Signed   By: Jearld Lesch M.D.   On: 09/11/2022 15:43   DG Hand Complete Right  Result Date: 09/11/2022 CLINICAL DATA:  Fall, pain EXAM: RIGHT HAND - COMPLETE 3+ VIEW; RIGHT WRIST - COMPLETE 3+ VIEW COMPARISON:  None Available. FINDINGS: There is no evidence of fracture or dislocation. There is no evidence of arthropathy or other focal bone abnormality. Soft tissues are unremarkable. IMPRESSION: No fracture or dislocation of the right hand or wrist. Carpus is normally aligned. Electronically Signed   By: Jearld Lesch M.D.   On: 09/11/2022 15:40   DG Wrist Complete Right  Result Date: 09/11/2022 CLINICAL DATA:  Fall, pain EXAM: RIGHT HAND -  COMPLETE 3+ VIEW; RIGHT WRIST - COMPLETE 3+ VIEW COMPARISON:  None Available. FINDINGS: There is no evidence of fracture or dislocation. There is no evidence of arthropathy or other focal bone abnormality. Soft tissues are unremarkable. IMPRESSION: No fracture or dislocation of the right hand or wrist. Carpus is normally aligned. Electronically Signed   By: Jearld Lesch M.D.   On: 09/11/2022 15:40    Procedures .Ortho Injury Treatment  Date/Time: 09/11/2022 4:18 PM  Performed by: Darrick Grinder, PA-C Authorized by: Darrick Grinder, PA-C   Consent:    Consent obtained:  Verbal   Consent given by:  Patient   Risks discussed:  Stiffness and restricted joint movement   Alternatives discussed:  No treatmentInjury location: wrist Location details: right wrist Injury type: soft tissue Pre-procedure neurovascular assessment: neurovascularly intact Immobilization: Ace wrap. Post-procedure neurovascular assessment: post-procedure neurovascularly intact       Medications Ordered in ED Medications  HYDROcodone-acetaminophen (NORCO/VICODIN) 5-325 MG per tablet 1 tablet (1 tablet Oral Given 09/11/22 1450)    ED Course/ Medical Decision Making/ A&P                             Medical Decision Making Amount and/or Complexity of Data Reviewed Radiology: ordered.  Risk Prescription drug management.   This patient presents to the ED for concern of right wrist and shoulder pain post fall, this involves an extensive number of treatment options, and is a complaint that carries with it a high risk of complications and morbidity.  The differential diagnosis includes fracture, dislocation, soft tissue injury, others   Co morbidities that complicate the patient evaluation  Hypertension   Additional history obtained:  Additional history obtained from family at bedside   Imaging Studies ordered:  I ordered imaging studies including plain films of the right shoulder, wrist, and hand I  independently visualized and interpreted imaging which showed no acute fracture or dislocation I agree with the radiologist interpretation     Problem List / ED Course / Critical interventions / Medication management   I ordered medication including Norco for pain Reevaluation of the patient after these medicines showed that the patient improved I have reviewed the patients home medicines and have made adjustments as needed   Social Determinants of Health:  Patient is a retired Cytogeneticist / Admission - Considered:  Patient with negative x-rays of hand, wrist, and shoulder.  Pain is likely due to a sprain. Patient placed in ACE wrap for support.  Plan to discharge home at this time with recommendations for supportive care.  Patient may follow-up with orthopedics as needed.         Final Clinical Impression(s) / ED Diagnoses Final diagnoses:  Wrist sprain, right, initial encounter    Rx / DC Orders ED Discharge Orders     None  Darrick Grinder, PA-C 09/11/22 1621    Darrick Grinder, PA-C 09/11/22 1633    Pricilla Loveless, MD 09/12/22 [phone]

## 2022-09-11 NOTE — Discharge Instructions (Signed)
You were evaluated today for wrist pain.  Your x-rays were reassuring with no signs of fracture or dislocation.  You were placed in a brace for comfort.  I recommend taking acetaminophen and ibuprofen for pain.  You may take 600 mg of ibuprofen every 6 hours and up to 1000 mg of Tylenol every 6 hours.  Do not exceed 4000 mg of acetaminophen in a 24-hour period.  Please follow-up as needed with orthopedic surgery

## 2022-09-12 ENCOUNTER — Encounter: Payer: Self-pay | Admitting: Orthopedic Surgery

## 2022-09-12 ENCOUNTER — Ambulatory Visit: Payer: 59 | Admitting: Orthopedic Surgery

## 2022-09-12 DIAGNOSIS — M25531 Pain in right wrist: Secondary | ICD-10-CM

## 2022-09-12 NOTE — Progress Notes (Signed)
Office Visit Note   Patient: Adam Stephenson           Date of Birth: 1965-05-15           MRN: [id number] Visit Date: 09/12/2022 Requested by: Noberto Retort, MD [phone] [place],  [place] [address] PCP: Noberto Retort, MD  Subjective: Chief Complaint  Patient presents with   Right Hand - Pain    HPI: Adam Stephenson is a 57 y.o. male who presents to the office reporting right hand and wrist pain.  Date of injury 09/11/2022.  He was pulling a strap on a truck and the strap broke and he fell backwards.  This was primarily an axial load through the palm of his hand.  Radiographs including scaphoid view are reviewed and show no acute fracture.  Scapholunate interval is examined and does not appear significantly widened.  He is slightly better today than yesterday.  He is using an Ace wrap for compression with some relief..                ROS: All systems reviewed are negative as they relate to the chief complaint within the history of present illness.  Patient denies fevers or chills.  Assessment & Plan: Visit Diagnoses:  1. Pain in right wrist     Plan: Impression is right wrist pain following a fall.  No scaphoid fracture.  I do not detect that much swelling in the hand and wrist today compared to the left-hand side.  Has full composite motion of the fingers with flexion and extension including the thumb.  Pretty symmetric passive range of motion as well with no effusion in the joint of the wrist.  Plan is for conservative management with 3-week return to make sure that clinically he is doing better and possible repeat radiographs to really examine that scapholunate interval further.  Follow-Up Instructions: No follow-ups on file.   Orders:  No orders of the defined types were placed in this encounter.  No orders of the defined types were placed in this encounter.     Procedures: No procedures performed   Clinical Data: No additional  findings.  Objective: Vital Signs: There were no vitals taken for this visit.  Physical Exam:  Constitutional: Patient appears well-developed HEENT:  Head: Normocephalic Eyes:EOM are normal Neck: Normal range of motion Cardiovascular: Normal rate Pulmonary/chest: Effort normal Neurologic: Patient is alert Skin: Skin is warm Psychiatric: Patient has normal mood and affect  Ortho Exam: Ortho exam demonstrates symmetric composite flexion of the fingers on the right and left hand.  Minimal to no difference in swelling between the digits of the right hand versus left hand.  Passive wrist range of motion is symmetric between the right and left-hand side with extension to about 60 flexion to about 70 radial and ulnar deviation also symmetric to the left hand.  He does have some tenderness over the radial styloid on the left.  No definite snuffbox tenderness.  Radial pulse intact.  Contusion noted at the base of the thenar and hyperthenar muscle mass.  Specialty Comments:  No specialty comments available.  Imaging: No results found.   PMFS History: Patient Active Problem List   Diagnosis Date Noted   Pain of right heel 01/12/2017   Other tear of medial meniscus, current injury, left knee, subsequent encounter 03/02/2016   Clavicle fracture 07/14/2015   Past Medical History:  Diagnosis Date   Anxiety    Complication of anesthesia  2007- Colonoscopy had to be stopped due not breathing, 2016- at [facility]- BP pressure bottomed   Depression    GERD (gastroesophageal reflux disease)    Headache    History of kidney stones    x2  passed   Hyperlipidemia    Hypertension    Sleep apnea    USES CPAP   TBI (traumatic brain injury) (HCC) 2002   Tinea versicolor    arm   Umbilical hernia     Family History  Problem Relation Age of Onset   CAD Father    CAD Paternal Grandfather     Past Surgical History:  Procedure Laterality Date   ADENOIDECTOMY     CARPAL TUNNEL RELEASE  Right 07/14/2015   Procedure: CARPAL TUNNEL RELEASE;  Surgeon: Cammy Copa, MD;  Location: [facility] OR;  Service: Orthopedics;  Laterality: Right;   carpel tunnel left Left    CHOLECYSTECTOMY  1994   CLAVICLE HARDWARE REMOVAL Left 03/2009   CLAVICLE SURGERY Left 07/2008   COLONOSCOPY     polyp   ESOPHAGOGASTRODUODENOSCOPY ENDOSCOPY     FINGER GANGLION CYST EXCISION Right    HAND SURGERY Right    ring finger - cyst   HEMORRHOID SURGERY  2016   INSERTION OF MESH N/A 09/24/2019   Procedure: INSERTION OF MESH;  Surgeon: Abigail Miyamoto, MD;  Location: [facility];  Service: General;  Laterality: N/A;   KNEE ARTHROSCOPY WITH MENISCAL REPAIR Left 02/01/2016   Procedure: KNEE ARTHROSCOPY WITH MENISCAL ROOT REPAIR VS DEBRIDEMENT;  Surgeon: Cammy Copa, MD;  Location: [facility] OR;  Service: Orthopedics;  Laterality: Left;   MOUTH SURGERY     wisdom teeth removed   ORIF CLAVICLE FRACTURE Right 07/14/2015   ORIF CLAVICULAR FRACTURE Right 07/14/2015   Procedure: OPEN REDUCTION INTERNAL FIXATION (ORIF) RIGHT CLAVICLE FRACTURE, RIGHT CARPAL TUNNEL RELEASE;  Surgeon: Cammy Copa, MD;  Location: [facility];  Service: Orthopedics;  Laterality: Right;   SIGMOIDOSCOPY     TONSILLECTOMY     UMBILICAL HERNIA REPAIR N/A 09/24/2019   Procedure: UMBILICAL HERNIA REPAIR WITH MESH;  Surgeon: Abigail Miyamoto, MD;  Location: [facility];  Service: General;  Laterality: N/A;   Social History   Occupational History   Occupation: Retired-police  Tobacco Use   Smoking status: Never   Smokeless tobacco: Never  Vaping Use   Vaping Use: Never used  Substance and Sexual Activity   Alcohol use: Yes    Alcohol/week: 7.0 standard drinks of alcohol    Types: 3 Glasses of wine, 3 Cans of beer, 1 Shots of liquor per week    Comment: occassional   Drug use: No   Sexual activity: Not on file

## 2022-10-06 ENCOUNTER — Ambulatory Visit: Payer: 59 | Admitting: Orthopedic Surgery

## 2022-10-06 DIAGNOSIS — R200 Anesthesia of skin: Secondary | ICD-10-CM | POA: Diagnosis not present

## 2022-10-07 ENCOUNTER — Encounter: Payer: Self-pay | Admitting: Orthopedic Surgery

## 2022-10-07 NOTE — Progress Notes (Unsigned)
Office Visit Note   Patient: Adam Stephenson           Date of Birth: 1965/04/07           MRN: [id number] Visit Date: 10/06/2022 Requested by: Noberto Retort, MD [phone] [address] [place],  [place] [address] PCP: Noberto Retort, MD  Subjective: Chief Complaint  Patient presents with   Right Hand - Follow-up    HPI: Adam Stephenson is a 57 y.o. male who presents to the office reporting for follow-up of right hand injury 09/11/2022.  States he is functioning well but still has some soreness.  Overall doing better with no real issues.  Also reports some numbness in the left foot for about a year.  Affects primarily the dorsal part of his foot.  No problems with the right foot.  Denies much in the way of back pain..                ROS: All systems reviewed are negative as they relate to the chief complaint within the history of present illness.  Patient denies fevers or chills.  Assessment & Plan: Visit Diagnoses:  1. Numbness of foot     Plan: Impression is hand is doing better following fall with injury.  No snuffbox tenderness and good range of motion at this time.  No indication for further workup on the right hand and wrist based on clinical examination today.  The left foot has some dorsal paresthesias and no real plantar paresthesias.  Could be neuropathy or some type of focal nerve compression either at the fibular head on the left or in the back.  Been going on for a year.  No weakness but EMG nerve study indicated to rule out nerve compression.  Follow-up after that study.  Follow-Up Instructions: No follow-ups on file.   Orders:  Orders Placed This Encounter  Procedures   Ambulatory referral to Physical Medicine Rehab   No orders of the defined types were placed in this encounter.     Procedures: No procedures performed   Clinical Data: No additional findings.  Objective: Vital Signs: There were no vitals taken for this visit.  Physical Exam:   Constitutional: Patient appears well-developed HEENT:  Head: Normocephalic Eyes:EOM are normal Neck: Normal range of motion Cardiovascular: Normal rate Pulmonary/chest: Effort normal Neurologic: Patient is alert Skin: Skin is warm Psychiatric: Patient has normal mood and affect  Ortho Exam: Ortho exam demonstrates excellent wrist range of motion right and left hand.  EPL FPL interosseous strength is intact.  No snuffbox tenderness radial styloid tenderness or scaphoid tenderness present.  No effusion in the wrist joint.  Radial and ulnar deviation nontender on the right.  The left-hand side is Palpable pedal pulses with intact ankle dorsiflexion plantarflexion strength.  Does have some dorsal paresthesias with no tenderness over the fibular head.  No real plantar paresthesias present.  No nerve root tension signs on the left.  Specialty Comments:  No specialty comments available.  Imaging: No results found.   PMFS History: Patient Active Problem List   Diagnosis Date Noted   Pain of right heel 01/12/2017   Other tear of medial meniscus, current injury, left knee, subsequent encounter 03/02/2016   Clavicle fracture 07/14/2015   Past Medical History:  Diagnosis Date   Anxiety    Complication of anesthesia     2007- Colonoscopy had to be stopped due not breathing, 2016- at [facility]- BP pressure bottomed  Depression    GERD (gastroesophageal reflux disease)    Headache    History of kidney stones    x2  passed   Hyperlipidemia    Hypertension    Sleep apnea    USES CPAP   TBI (traumatic brain injury) (HCC) 2002   Tinea versicolor    arm   Umbilical hernia     Family History  Problem Relation Age of Onset   CAD Father    CAD Paternal Grandfather     Past Surgical History:  Procedure Laterality Date   ADENOIDECTOMY     CARPAL TUNNEL RELEASE Right 07/14/2015   Procedure: CARPAL TUNNEL RELEASE;  Surgeon: Cammy Copa, MD;  Location: [facility];  Service: Orthopedics;   Laterality: Right;   carpel tunnel left Left    CHOLECYSTECTOMY  1994   CLAVICLE HARDWARE REMOVAL Left 03/2009   CLAVICLE SURGERY Left 07/2008   COLONOSCOPY     polyp   ESOPHAGOGASTRODUODENOSCOPY ENDOSCOPY     FINGER GANGLION CYST EXCISION Right    HAND SURGERY Right    ring finger - cyst   HEMORRHOID SURGERY  2016   INSERTION OF MESH N/A 09/24/2019   Procedure: INSERTION OF MESH;  Surgeon: Abigail Miyamoto, MD;  Location: [facility];  Service: General;  Laterality: N/A;   KNEE ARTHROSCOPY WITH MENISCAL REPAIR Left 02/01/2016   Procedure: KNEE ARTHROSCOPY WITH MENISCAL ROOT REPAIR VS DEBRIDEMENT;  Surgeon: Cammy Copa, MD;  Location: [facility];  Service: Orthopedics;  Laterality: Left;   MOUTH SURGERY     wisdom teeth removed   ORIF CLAVICLE FRACTURE Right 07/14/2015   ORIF CLAVICULAR FRACTURE Right 07/14/2015   Procedure: OPEN REDUCTION INTERNAL FIXATION (ORIF) RIGHT CLAVICLE FRACTURE, RIGHT CARPAL TUNNEL RELEASE;  Surgeon: Cammy Copa, MD;  Location: [facility];  Service: Orthopedics;  Laterality: Right;   SIGMOIDOSCOPY     TONSILLECTOMY     UMBILICAL HERNIA REPAIR N/A 09/24/2019   Procedure: UMBILICAL HERNIA REPAIR WITH MESH;  Surgeon: Abigail Miyamoto, MD;  Location: [facility];  Service: General;  Laterality: N/A;   Social History   Occupational History   Occupation: Retired-police  Tobacco Use   Smoking status: Never   Smokeless tobacco: Never  Vaping Use   Vaping status: Never Used  Substance and Sexual Activity   Alcohol use: Yes    Alcohol/week: 7.0 standard drinks of alcohol    Types: 3 Glasses of wine, 3 Cans of beer, 1 Shots of liquor per week    Comment: occassional   Drug use: No   Sexual activity: Not on file

## 2022-10-12 ENCOUNTER — Other Ambulatory Visit: Payer: Self-pay

## 2022-10-12 DIAGNOSIS — R200 Anesthesia of skin: Secondary | ICD-10-CM

## 2022-10-14 ENCOUNTER — Encounter: Payer: Self-pay | Admitting: Neurology

## 2022-10-14 ENCOUNTER — Other Ambulatory Visit: Payer: Self-pay

## 2022-10-14 DIAGNOSIS — R202 Paresthesia of skin: Secondary | ICD-10-CM

## 2022-10-28 ENCOUNTER — Ambulatory Visit (INDEPENDENT_AMBULATORY_CARE_PROVIDER_SITE_OTHER): Payer: 59 | Admitting: Neurology

## 2022-10-28 DIAGNOSIS — R202 Paresthesia of skin: Secondary | ICD-10-CM | POA: Diagnosis not present

## 2022-10-28 DIAGNOSIS — M5416 Radiculopathy, lumbar region: Secondary | ICD-10-CM

## 2022-10-28 NOTE — Procedures (Signed)
  [facility]  [address], [address]  [place], [place] [address] Tel: [phone] Fax: [phone] Test Date:  10/28/2022  Patient: Adam Stephenson DOB: Feb 19, 1966 Physician: Nita Sickle, DO  Sex: Male Height: 6\' 0"  Ref Phys: Cammy Copa, MD  ID#: [id number]   Technician:    History: This is a 57 year old man referred for evaluation of numbness over the left foot.  NCV & EMG Findings: Extensive electrodiagnostic testing of the left lower extremity shows:  Left sural and superficial peroneal sensory responses are within normal limits.   Left peroneal motor response at the extensor digitorum brevis is reduced, normal at the tibialis anterior.  Left tibial motor responses within normal limits. Left tibial H reflex study is within normal limits. Chronic motor axonal loss changes are seen affecting the L5 myotome on the left, without accompanied active denervation.  Impression: Chronic L5 radiculopathy affecting the left lower extremity, moderate. There is no evidence of a peroneal mononeuropathy or sensorimotor polyneuropathy affecting the left lower extremity.   ___________________________ Nita Sickle, DO    Nerve Conduction Studies   Stim Site NR Peak (ms) Norm Peak (ms) O-P Amp (V) Norm O-P Amp  Left Sup Peroneal Anti Sensory (Ant Lat Mall)  32 C  12 cm    2.9 <4.6 4.7 >4  Left Sural Anti Sensory (Lat Mall)  32 C  Calf    3.1 <4.6 6.6 >4     Stim Site NR Onset (ms) Norm Onset (ms) O-P Amp (mV) Norm O-P Amp Site1 Site2 Delta-0 (ms) Dist (cm) Vel (m/s) Norm Vel (m/s)  Left Peroneal Motor (Ext Dig Brev)  32 C  Ankle    5.4 <6.0 *1.4 >2.5 B Fib Ankle 8.2 37.0 45 >40  B Fib    13.6  1.4  Poplt B Fib 1.9 10.0 53 >40  Poplt    15.5  1.4         Left Peroneal TA Motor (Tib Ant)  32 C  Fib Head    3.0 <4.5 4.2 >3 Poplit Fib Head 2.1 10.0 48 >40  Poplit    5.1 <5.7 4.1         Left Tibial Motor (Abd Hall Brev)  32 C  Ankle    3.7 <6.0 8.2 >4 Knee  Ankle 10.3 44.0 43 >40  Knee    14.0  4.5          Electromyography   Side Muscle Ins.Act Fibs Fasc Recrt Amp Dur Poly Activation Comment  Left AntTibialis Nml Nml Nml *2- *1+ *1+ *1+ Nml N/A  Left Gastroc Nml Nml Nml Nml Nml Nml Nml Nml N/A  Left Flex Dig Long Nml Nml Nml *2- *1+ *1+ *1+ Nml N/A  Left RectFemoris Nml Nml Nml Nml Nml Nml Nml Nml N/A  Left GluteusMed Nml Nml Nml *2- *1+ *1+ *1+ Nml N/A      Waveforms:
